# Patient Record
Sex: Female | Born: 1945 | Race: White | Hispanic: No | Smoking: Never smoker
Health system: Southern US, Community
[De-identification: ages and names within clinical notes are randomized; demographics above are authoritative.]

## PROBLEM LIST (undated history)

## (undated) DIAGNOSIS — K219 Gastro-esophageal reflux disease without esophagitis: Secondary | ICD-10-CM

## (undated) DIAGNOSIS — T7840XA Allergy, unspecified, initial encounter: Secondary | ICD-10-CM

## (undated) DIAGNOSIS — H269 Unspecified cataract: Secondary | ICD-10-CM

## (undated) DIAGNOSIS — Z860100 Personal history of colon polyps, unspecified: Secondary | ICD-10-CM

## (undated) DIAGNOSIS — M2550 Pain in unspecified joint: Secondary | ICD-10-CM

## (undated) DIAGNOSIS — I10 Essential (primary) hypertension: Secondary | ICD-10-CM

## (undated) DIAGNOSIS — E785 Hyperlipidemia, unspecified: Secondary | ICD-10-CM

## (undated) DIAGNOSIS — M1990 Unspecified osteoarthritis, unspecified site: Secondary | ICD-10-CM

## (undated) HISTORY — PX: JOINT REPLACEMENT: SHX530

## (undated) HISTORY — PX: COLONOSCOPY: SHX174

## (undated) HISTORY — DX: Allergy, unspecified, initial encounter: T78.40XA

## (undated) HISTORY — DX: Unspecified cataract: H26.9

## (undated) HISTORY — PX: TONSILLECTOMY: SUR1361

## (undated) HISTORY — PX: EYE SURGERY: SHX253

## (undated) HISTORY — PX: OTHER SURGICAL HISTORY: SHX169

---

## 1986-11-09 HISTORY — PX: WRIST SURGERY: SHX841

## 1992-11-09 HISTORY — PX: ABDOMINAL HYSTERECTOMY: SHX81

## 2015-11-10 HISTORY — PX: CARDIAC CATHETERIZATION: SHX172

## 2015-12-30 ENCOUNTER — Encounter (HOSPITAL_COMMUNITY)
Admission: RE | Disposition: A | Discharge status: Home / Self Care | Payer: Self-pay | Source: Ambulatory Visit | Attending: Ophthalmology

## 2015-12-30 ENCOUNTER — Ambulatory Visit (HOSPITAL_COMMUNITY)
Admission: RE | Admit: 2015-12-30 | Discharge: 2015-12-30 | Disposition: A | Discharge status: Home / Self Care | Payer: Medicare Other | Source: Ambulatory Visit | Attending: Ophthalmology | Admitting: Ophthalmology

## 2015-12-30 DIAGNOSIS — H2640 Unspecified secondary cataract: Secondary | ICD-10-CM | POA: Insufficient documentation

## 2015-12-30 DIAGNOSIS — H26492 Other secondary cataract, left eye: Secondary | ICD-10-CM | POA: Diagnosis not present

## 2015-12-30 HISTORY — PX: YAG LASER APPLICATION: SHX6189

## 2015-12-30 SURGERY — TREATMENT, USING YAG LASER
Anesthesia: LOCAL | Laterality: Left

## 2015-12-30 MED ORDER — TROPICAMIDE 1 % OP SOLN
OPHTHALMIC | Status: AC
  Filled 2015-12-30: qty 3

## 2015-12-30 MED ORDER — TROPICAMIDE 1 % OP SOLN
1.0000 [drp] | Freq: Once | OPHTHALMIC | Status: AC
  Administered 2015-12-30: 1 [drp] via OPHTHALMIC

## 2015-12-30 MED ORDER — TETRACAINE HCL 0.5 % OP SOLN
1.0000 [drp] | OPHTHALMIC | Status: AC | PRN
  Administered 2015-12-30: 1 [drp] via OPHTHALMIC

## 2015-12-30 MED ORDER — TETRACAINE HCL 0.5 % OP SOLN
OPHTHALMIC | Status: AC
  Filled 2015-12-30: qty 4

## 2015-12-30 NOTE — Brief Op Note (Signed)
Heather Cox 12/30/2015  Susa Simmonds, MD  Pre-op Diagnosis:  secondary cataract left eye  Post-op Diagnosis:   same  Yag laser self-test completed: Yes.    Indications:  See office scanned H&P  Procedure:   YAG poterior capsulotomy OS  Eye protection worn by staff:  Yes.   Laser In Use sign on door:  Yes.    Laser:  {LUMENIS YAG/SLT LASER  selecta duet  Power Setting:  1.7 mJ/burst Anatomical site treated:  Posterior capsule OS Number of applications:  42 Total energy delivered: 69.07 mJ Results:  Clear visualaxis  Patient was instructed to go to the office, as previously scheduled, for intraocular pressure:  No.  Patient verbalizes understanding of discharge instructions:  Yes.    Notes: pt tolerated procedure well, no complications

## 2015-12-30 NOTE — H&P (Signed)
I have reviewed the pre printed H&P, the patient was re-examined, and I have identified no significant interval changes in the patient's medical condition.  There is no change in the plan of care since the history and physical of record. 

## 2015-12-30 NOTE — Discharge Instructions (Signed)
[name]  12/30/2015     Instructions    Activity: No Restrictions.   Diet: Resume Diet you were on at home.   Pain Medication: Tylenol if Needed.   CONTACT YOUR DOCTOR IF YOU HAVE PAIN, REDNESS IN YOUR EYE, OR DECREASED VISION.   Follow-up:in 3 weeks with Susa Simmonds, MD.   Dr. Nile Riggs: [phone]  Dr. Lita Mains: [phone]  Dr. Alto Denver: [phone]   If you find that you cannot contact your physician, but feel that your signs and   Symptoms warrant a physician's attention, call the Emergency Room at   [phone].   Othern/a.  FOLLOW UP WITH DR. HAINES IN HIS OFFICE ON January 21, 2016 AT 10:45 AM

## 2015-12-31 ENCOUNTER — Encounter (HOSPITAL_COMMUNITY): Payer: Self-pay | Admitting: Ophthalmology

## 2016-02-03 DIAGNOSIS — H35372 Puckering of macula, left eye: Secondary | ICD-10-CM | POA: Diagnosis not present

## 2016-02-20 ENCOUNTER — Encounter (INDEPENDENT_AMBULATORY_CARE_PROVIDER_SITE_OTHER): Payer: Self-pay | Admitting: *Deleted

## 2016-02-20 ENCOUNTER — Encounter (INDEPENDENT_AMBULATORY_CARE_PROVIDER_SITE_OTHER): Payer: Self-pay

## 2016-03-04 DIAGNOSIS — M170 Bilateral primary osteoarthritis of knee: Secondary | ICD-10-CM | POA: Diagnosis not present

## 2016-03-04 DIAGNOSIS — M1711 Unilateral primary osteoarthritis, right knee: Secondary | ICD-10-CM | POA: Diagnosis not present

## 2016-03-04 DIAGNOSIS — M1712 Unilateral primary osteoarthritis, left knee: Secondary | ICD-10-CM | POA: Diagnosis not present

## 2016-03-10 DIAGNOSIS — M1711 Unilateral primary osteoarthritis, right knee: Secondary | ICD-10-CM | POA: Diagnosis not present

## 2016-03-10 DIAGNOSIS — M170 Bilateral primary osteoarthritis of knee: Secondary | ICD-10-CM | POA: Diagnosis not present

## 2016-03-10 DIAGNOSIS — M1712 Unilateral primary osteoarthritis, left knee: Secondary | ICD-10-CM | POA: Diagnosis not present

## 2016-03-17 DIAGNOSIS — R9439 Abnormal result of other cardiovascular function study: Secondary | ICD-10-CM | POA: Diagnosis not present

## 2016-03-17 DIAGNOSIS — R0602 Shortness of breath: Secondary | ICD-10-CM | POA: Diagnosis not present

## 2016-03-20 DIAGNOSIS — Z01818 Encounter for other preprocedural examination: Secondary | ICD-10-CM | POA: Diagnosis not present

## 2016-03-20 DIAGNOSIS — I10 Essential (primary) hypertension: Secondary | ICD-10-CM | POA: Diagnosis not present

## 2016-03-25 DIAGNOSIS — I10 Essential (primary) hypertension: Secondary | ICD-10-CM | POA: Diagnosis not present

## 2016-03-25 DIAGNOSIS — Z791 Long term (current) use of non-steroidal anti-inflammatories (NSAID): Secondary | ICD-10-CM | POA: Diagnosis not present

## 2016-03-25 DIAGNOSIS — E785 Hyperlipidemia, unspecified: Secondary | ICD-10-CM | POA: Diagnosis not present

## 2016-03-25 DIAGNOSIS — R9439 Abnormal result of other cardiovascular function study: Secondary | ICD-10-CM | POA: Diagnosis not present

## 2016-03-25 DIAGNOSIS — Z91013 Allergy to seafood: Secondary | ICD-10-CM | POA: Diagnosis not present

## 2016-03-25 DIAGNOSIS — Z79899 Other long term (current) drug therapy: Secondary | ICD-10-CM | POA: Diagnosis not present

## 2016-03-25 DIAGNOSIS — R0602 Shortness of breath: Secondary | ICD-10-CM | POA: Diagnosis not present

## 2016-04-10 ENCOUNTER — Ambulatory Visit: Payer: Self-pay | Admitting: Orthopedic Surgery

## 2016-04-28 DIAGNOSIS — M1711 Unilateral primary osteoarthritis, right knee: Secondary | ICD-10-CM | POA: Diagnosis not present

## 2016-05-06 DIAGNOSIS — H35372 Puckering of macula, left eye: Secondary | ICD-10-CM | POA: Diagnosis not present

## 2016-05-25 ENCOUNTER — Ambulatory Visit: Payer: Self-pay | Admitting: Orthopedic Surgery

## 2016-05-25 DIAGNOSIS — M25562 Pain in left knee: Secondary | ICD-10-CM | POA: Diagnosis not present

## 2016-05-25 DIAGNOSIS — M1712 Unilateral primary osteoarthritis, left knee: Secondary | ICD-10-CM | POA: Diagnosis not present

## 2016-05-25 NOTE — H&P (Signed)
TOTAL KNEE ADMISSION H&P  Patient is being admitted for right total knee arthroplasty.  Subjective:  Chief Complaint:right knee pain.  HPI: Heather Cox, 70 y.o. female, has a history of pain and functional disability in the right knee due to arthritis and has failed non-surgical conservative treatments for greater than 12 weeks to includeNSAID's and/or analgesics, corticosteriod injections, flexibility and strengthening excercises, use of assistive devices, weight reduction as appropriate and activity modification.  Onset of symptoms was gradual, starting >10 years ago with gradually worsening course since that time. The patient noted no past surgery on the right knee(s).  Patient currently rates pain in the right knee(s) at 10 out of 10 with activity. Patient has night pain, worsening of pain with activity and weight bearing, pain that interferes with activities of daily living, pain with passive range of motion and joint swelling.  Patient has evidence of subchondral cysts, subchondral sclerosis, periarticular osteophytes and joint space narrowing by imaging studies. There is no active infection.  There are no active problems to display for this patient.  No past medical history on file.  Past Surgical History  Procedure Laterality Date  . Yag laser application Left 12/30/2015    Procedure: YAG LASER APPLICATION;  Surgeon: Susa Simmonds, MD;  Location: [facility];  Service: Ophthalmology;  Laterality: Left;  rescheduled to 2/20      (Not in a hospital admission) No Known Allergies  Social History  Substance Use Topics  . Smoking status: Not on file  . Smokeless tobacco: Not on file  . Alcohol Use: Not on file    No family history on file.   Review of Systems  Constitutional: Negative.   HENT: Negative.   Eyes: Negative.   Respiratory: Negative.   Cardiovascular: Negative.   Gastrointestinal: Negative.   Genitourinary: Negative.   Musculoskeletal: Positive for joint pain.   Neurological: Negative.   Endo/Heme/Allergies: Negative.   Psychiatric/Behavioral: Negative.     Objective:  Physical Exam  Constitutional: She is oriented to person, place, and time. She appears well-developed and well-nourished.  HENT:  Head: Normocephalic and atraumatic.  Eyes: Conjunctivae and EOM are normal. Pupils are equal, round, and reactive to light.  Neck: Normal range of motion. Neck supple.  Cardiovascular: Normal rate and regular rhythm.   Respiratory: Effort normal and breath sounds normal. No respiratory distress.  GI: Soft. Bowel sounds are normal. She exhibits no distension.  Genitourinary:  deferred  Musculoskeletal:       Right knee: She exhibits decreased range of motion. Tenderness found. Medial joint line and lateral joint line tenderness noted.  ROM 8-115  Neurological: She is alert and oriented to person, place, and time. She has normal reflexes.  Skin: Skin is warm.  Psychiatric: She has a normal mood and affect. Her behavior is normal. Judgment and thought content normal.    Vital signs in last 24 hours: @  Labs:   There is no height or weight on file to calculate BMI.   Imaging Review Plain radiographs demonstrate severe degenerative joint disease of the bilaterally knee(s). The overall alignment issignificant varus. The bone quality appears to be adequate for age and reported activity level.  Assessment/Plan:  End stage arthritis, right knee   The patient history, physical examination, clinical judgment of the provider and imaging studies are consistent with end stage degenerative joint disease of the right knee(s) and total knee arthroplasty is deemed medically necessary. The treatment options including medical management, injection therapy arthroscopy and arthroplasty were discussed  at length. The risks and benefits of total knee arthroplasty were presented and reviewed. The risks due to aseptic loosening, infection, stiffness,  patella tracking problems, thromboembolic complications and other imponderables were discussed. The patient acknowledged the explanation, agreed to proceed with the plan and consent was signed. Patient is being admitted for inpatient treatment for surgery, pain control, PT, OT, prophylactic antibiotics, VTE prophylaxis, progressive ambulation and ADL's and discharge planning. The patient is planning to be discharged home with home health services

## 2016-05-28 ENCOUNTER — Encounter (HOSPITAL_COMMUNITY)
Admission: RE | Admit: 2016-05-28 | Discharge: 2016-05-28 | Disposition: A | Discharge status: Home / Self Care | Payer: Medicare Other | Source: Ambulatory Visit | Attending: Orthopedic Surgery | Admitting: Orthopedic Surgery

## 2016-05-28 ENCOUNTER — Encounter (HOSPITAL_COMMUNITY): Payer: Self-pay

## 2016-05-28 DIAGNOSIS — E785 Hyperlipidemia, unspecified: Secondary | ICD-10-CM | POA: Diagnosis not present

## 2016-05-28 DIAGNOSIS — K219 Gastro-esophageal reflux disease without esophagitis: Secondary | ICD-10-CM | POA: Insufficient documentation

## 2016-05-28 DIAGNOSIS — Z01812 Encounter for preprocedural laboratory examination: Secondary | ICD-10-CM | POA: Diagnosis not present

## 2016-05-28 DIAGNOSIS — Z01818 Encounter for other preprocedural examination: Secondary | ICD-10-CM | POA: Insufficient documentation

## 2016-05-28 DIAGNOSIS — M1711 Unilateral primary osteoarthritis, right knee: Secondary | ICD-10-CM | POA: Diagnosis not present

## 2016-05-28 DIAGNOSIS — Z79899 Other long term (current) drug therapy: Secondary | ICD-10-CM | POA: Diagnosis not present

## 2016-05-28 DIAGNOSIS — I10 Essential (primary) hypertension: Secondary | ICD-10-CM | POA: Diagnosis not present

## 2016-05-28 DIAGNOSIS — Z0183 Encounter for blood typing: Secondary | ICD-10-CM | POA: Insufficient documentation

## 2016-05-28 HISTORY — DX: Unspecified osteoarthritis, unspecified site: M19.90

## 2016-05-28 HISTORY — DX: Personal history of colonic polyps: Z86.010

## 2016-05-28 HISTORY — DX: Gastro-esophageal reflux disease without esophagitis: K21.9

## 2016-05-28 HISTORY — DX: Hyperlipidemia, unspecified: E78.5

## 2016-05-28 HISTORY — DX: Pain in unspecified joint: M25.50

## 2016-05-28 HISTORY — DX: Personal history of colon polyps, unspecified: Z86.0100

## 2016-05-28 HISTORY — DX: Essential (primary) hypertension: I10

## 2016-05-28 LAB — BASIC METABOLIC PANEL
ANION GAP: 6 (ref 5–15)
BUN: 23 mg/dL — AB (ref 6–20)
CO2: 25 mmol/L (ref 22–32)
Calcium: 9.3 mg/dL (ref 8.9–10.3)
Chloride: 108 mmol/L (ref 101–111)
Creatinine, Ser: 0.95 mg/dL (ref 0.44–1.00)
GFR calc Af Amer: >60 mL/min (ref >60)
GFR, EST NON AFRICAN AMERICAN: 58 mL/min — AB (ref >60)
GLUCOSE: 92 mg/dL (ref 65–99)
POTASSIUM: 4 mmol/L (ref 3.5–5.1)
Sodium: 139 mmol/L (ref 135–145)

## 2016-05-28 LAB — TYPE AND SCREEN
ABO/RH(D): O POS
Antibody Screen: NEGATIVE

## 2016-05-28 LAB — CBC
HEMATOCRIT: 43.8 % (ref 36.0–46.0)
HEMOGLOBIN: 13.8 g/dL (ref 12.0–15.0)
MCH: 28.8 pg (ref 26.0–34.0)
MCHC: 31.5 g/dL (ref 30.0–36.0)
MCV: 91.3 fL (ref 78.0–100.0)
Platelets: 239 10*3/uL (ref 150–400)
RBC: 4.8 MIL/uL (ref 3.87–5.11)
RDW: 13.6 % (ref 11.5–15.5)
WBC: 7.9 10*3/uL (ref 4.0–10.5)

## 2016-05-28 LAB — ABO/RH: ABO/RH(D): O POS

## 2016-05-28 LAB — SURGICAL PCR SCREEN
MRSA, PCR: NEGATIVE
Staphylococcus aureus: NEGATIVE

## 2016-05-28 MED ORDER — CHLORHEXIDINE GLUCONATE 4 % EX LIQD: 60.0000 mL | Freq: Once | CUTANEOUS | Status: DC

## 2016-05-28 NOTE — Progress Notes (Addendum)
Cardiologist is Dr.Clevenger in epic from 03-17-16 under care everywhere  Medical Md Dr.[place]  [facility] [place]  Stress test to be [facility]  Heart cath report under care everywhere  [facility]  [facility]

## 2016-05-28 NOTE — Pre-Procedure Instructions (Signed)
Heather Cox  05/28/2016      [facility] - [place], [place] - [address] [place] [address] Phone: [phone] Fax: [phone]  [facility] - [address], [place] - [address] [place] [address] Phone: [phone] Fax: [phone]    Your procedure is scheduled on Mon, July 31 @ 7:15 AM  [facility]:30 AM  Call this number if you have problems the morning of surgery:  [phone]   Remember:  Do not eat food or drink liquids after midnight.  Take these medicines the morning of surgery with A SIP OF WATER Atenolol(Tenormin)              Stop taking your Naproxen and Anacin. No Goody's,BC's,Advil,Motrin,Ibuprofen,Aspirin,Fish Oil,or any Herbal Medications.    Do not wear jewelry, make-up or nail polish.  Do not wear lotions, powders, or perfumes.    Do not shave 48 hours prior to surgery.    Do not bring valuables to the hospital.  [facility] is not responsible for any belongings or valuables.  Contacts, dentures or bridgework may not be worn into surgery.  Leave your suitcase in the car.  After surgery it may be brought to your room.  For patients admitted to the hospital, discharge time will be determined by your treatment team.  Patients discharged the day of surgery will not be allowed to drive home.    [facility]  Before surgery, you can play an important role.  Because skin is not sterile, your skin needs to be as free of germs as possible.  You can reduce the number of germs on you skin by washing with CHG (chlorahexidine gluconate) soap before surgery.  CHG is an antiseptic cleaner which kills germs and bonds with the skin to continue killing germs even after washing.  Please DO NOT use if you have an allergy to CHG or antibacterial soaps.  If your skin becomes reddened/irritated stop using  the CHG and inform your nurse when you arrive at Short Stay.  Do not shave (including legs and underarms) for at least 48 hours prior to the first CHG shower.  You may shave your face.  Please follow these instructions carefully:   1.  Shower with CHG Soap the night before surgery and the                                morning of Surgery.  2.  If you choose to wash your hair, wash your hair first as usual with your       normal shampoo.  3.  After you shampoo, rinse your hair and body thoroughly to remove the                      Shampoo.  4.  Use CHG as you would any other liquid soap.  You can apply chg directly       to the skin and wash gently with scrungie or a clean washcloth.  5.  Apply the CHG Soap to your body ONLY FROM THE NECK DOWN.        Do not use on open wounds or open sores.  Avoid contact with your eyes,       ears, mouth and genitals (private parts).  Wash  genitals (private parts)       with your normal soap.  6.  Wash thoroughly, paying special attention to the area where your surgery        will be performed.  7.  Thoroughly rinse your body with warm water from the neck down.  8.  DO NOT shower/wash with your normal soap after using and rinsing off       the CHG Soap.  9.  Pat yourself dry with a clean towel.            10.  Wear clean pajamas.            11.  Place clean sheets on your bed the night of your first shower and do not        sleep with pets.  Day of Surgery  Do not apply any lotions/deoderants the morning of surgery.  Please wear clean clothes to the hospital/surgery center.   Please read over the following fact sheets that you were given. Pain Booklet, Coughing and Deep Breathing, MRSA Information and Surgical Site Infection Prevention

## 2016-05-29 NOTE — Progress Notes (Signed)
Anesthesia Chart Review:  Pt is a 70 year old female scheduled for R total knee arthroplasty with computer navigation on 06/08/2016 with Samson FredericBrian Swinteck, MD.   PCP is Doreen Beamhruv Vyas, MD and cardiologist is Abelardo DieselJeffrey Clevenger, MD, both of whom have cleared pt for surgery.   PMH includes:  HTN, hyperlipidemia, GERD. Never smoker. BMI 37.5  Medications include: Atenolol, lipitor  Preoperative labs reviewed.    Chest x-ray 08/02/15 reviewed [facility] No active cardiopulmonary disease.    EKG 03/17/16: sinus rhythm. RSR (V1)  Cardiac cath 03/25/16 (Care everywhere): 1.Angiographically normal coronary arteries. 2. Right radial access.  Nuclear stress test 09/02/15: abnormal stress test, stress induced anteroapical defect  If no changes, I anticipate pt can proceed with surgery as scheduled.   Rica Mastngela Tariyah Pendry, FNP-BC [facility]Anesthesiology Phone: [phone] 05/29/2016 1:41 PM

## 2016-06-05 MED ORDER — TRANEXAMIC ACID 1000 MG/10ML IV SOLN
1000.0000 mg | INTRAVENOUS | Status: AC
  Administered 2016-06-08: 1000 mg via INTRAVENOUS
  Filled 2016-06-05: qty 10

## 2016-06-07 MED ORDER — CEFAZOLIN SODIUM-DEXTROSE 2-4 GM/100ML-% IV SOLN
2.0000 g | INTRAVENOUS | Status: AC
  Administered 2016-06-08: 2 g via INTRAVENOUS
  Filled 2016-06-07: qty 100

## 2016-06-08 ENCOUNTER — Encounter (HOSPITAL_COMMUNITY): Payer: Self-pay | Admitting: Anesthesiology

## 2016-06-08 ENCOUNTER — Inpatient Hospital Stay (HOSPITAL_COMMUNITY)
Admission: RE | Admit: 2016-06-08 | Discharge: 2016-06-09 | DRG: 470 | Disposition: A | Discharge status: Home / Self Care | Payer: Medicare Other | Source: Ambulatory Visit | Attending: Orthopedic Surgery | Admitting: Orthopedic Surgery

## 2016-06-08 ENCOUNTER — Inpatient Hospital Stay (HOSPITAL_COMMUNITY): Payer: Medicare Other | Admitting: Anesthesiology

## 2016-06-08 ENCOUNTER — Inpatient Hospital Stay (HOSPITAL_COMMUNITY): Payer: Medicare Other | Admitting: Emergency Medicine

## 2016-06-08 ENCOUNTER — Inpatient Hospital Stay (HOSPITAL_COMMUNITY): Payer: Medicare Other

## 2016-06-08 ENCOUNTER — Encounter (HOSPITAL_COMMUNITY)
Admission: RE | Disposition: A | Discharge status: Home / Self Care | Payer: Self-pay | Source: Ambulatory Visit | Attending: Orthopedic Surgery

## 2016-06-08 DIAGNOSIS — M179 Osteoarthritis of knee, unspecified: Secondary | ICD-10-CM | POA: Diagnosis not present

## 2016-06-08 DIAGNOSIS — K219 Gastro-esophageal reflux disease without esophagitis: Secondary | ICD-10-CM | POA: Diagnosis present

## 2016-06-08 DIAGNOSIS — M1711 Unilateral primary osteoarthritis, right knee: Secondary | ICD-10-CM | POA: Diagnosis present

## 2016-06-08 DIAGNOSIS — Z09 Encounter for follow-up examination after completed treatment for conditions other than malignant neoplasm: Secondary | ICD-10-CM

## 2016-06-08 DIAGNOSIS — Z96651 Presence of right artificial knee joint: Secondary | ICD-10-CM | POA: Diagnosis not present

## 2016-06-08 DIAGNOSIS — I10 Essential (primary) hypertension: Secondary | ICD-10-CM | POA: Diagnosis present

## 2016-06-08 DIAGNOSIS — M25561 Pain in right knee: Secondary | ICD-10-CM | POA: Diagnosis not present

## 2016-06-08 DIAGNOSIS — E785 Hyperlipidemia, unspecified: Secondary | ICD-10-CM | POA: Diagnosis not present

## 2016-06-08 DIAGNOSIS — M1990 Unspecified osteoarthritis, unspecified site: Secondary | ICD-10-CM | POA: Diagnosis not present

## 2016-06-08 DIAGNOSIS — Z471 Aftercare following joint replacement surgery: Secondary | ICD-10-CM | POA: Diagnosis not present

## 2016-06-08 HISTORY — PX: KNEE ARTHROPLASTY: SHX992

## 2016-06-08 SURGERY — ARTHROPLASTY, KNEE, TOTAL, USING IMAGELESS COMPUTER-ASSISTED NAVIGATION
Anesthesia: Spinal | Site: Knee | Laterality: Right

## 2016-06-08 MED ORDER — MIDAZOLAM HCL 5 MG/5ML IJ SOLN
INTRAMUSCULAR | Status: DC | PRN
Start: 2016-06-08 — End: 2016-06-08
  Administered 2016-06-08: 0.5 mg via INTRAVENOUS
  Administered 2016-06-08: 1 mg via INTRAVENOUS

## 2016-06-08 MED ORDER — DOCUSATE SODIUM 100 MG PO CAPS
100.0000 mg | ORAL_CAPSULE | Freq: Two times a day (BID) | ORAL | Status: DC
  Administered 2016-06-08 – 2016-06-09 (×2): 100 mg via ORAL
  Filled 2016-06-08 (×2): qty 1

## 2016-06-08 MED ORDER — ACETAMINOPHEN 10 MG/ML IV SOLN
INTRAVENOUS | Status: AC
  Filled 2016-06-08: qty 100

## 2016-06-08 MED ORDER — NEOSTIGMINE METHYLSULFATE 10 MG/10ML IV SOLN
INTRAVENOUS | Status: DC | PRN
  Administered 2016-06-08: 3 mg via INTRAVENOUS

## 2016-06-08 MED ORDER — SUCCINYLCHOLINE CHLORIDE 200 MG/10ML IV SOSY
PREFILLED_SYRINGE | INTRAVENOUS | Status: AC
  Filled 2016-06-08: qty 10

## 2016-06-08 MED ORDER — FENTANYL CITRATE (PF) 100 MCG/2ML IJ SOLN
INTRAMUSCULAR | Status: DC | PRN
  Administered 2016-06-08: 75 ug via INTRAVENOUS
  Administered 2016-06-08: 50 ug via INTRAVENOUS
  Administered 2016-06-08: 75 ug via INTRAVENOUS
  Administered 2016-06-08: 50 ug via INTRAVENOUS

## 2016-06-08 MED ORDER — METOCLOPRAMIDE HCL 5 MG PO TABS: 5.0000 mg | ORAL_TABLET | Freq: Three times a day (TID) | ORAL | Status: DC | PRN

## 2016-06-08 MED ORDER — LIDOCAINE HCL (CARDIAC) 20 MG/ML IV SOLN
INTRAVENOUS | Status: DC | PRN
  Administered 2016-06-08: 80 mg via INTRAVENOUS

## 2016-06-08 MED ORDER — HYDROMORPHONE HCL 1 MG/ML IJ SOLN
INTRAMUSCULAR | Status: AC
  Filled 2016-06-08: qty 1

## 2016-06-08 MED ORDER — ROCURONIUM BROMIDE 50 MG/5ML IV SOLN
INTRAVENOUS | Status: AC
  Filled 2016-06-08: qty 1

## 2016-06-08 MED ORDER — DIPHENHYDRAMINE HCL 12.5 MG/5ML PO ELIX: 12.5000 mg | ORAL_SOLUTION | ORAL | Status: DC | PRN

## 2016-06-08 MED ORDER — ATORVASTATIN CALCIUM 10 MG PO TABS
10.0000 mg | ORAL_TABLET | Freq: Every day | ORAL | Status: DC
  Administered 2016-06-08: 10 mg via ORAL
  Filled 2016-06-08: qty 1

## 2016-06-08 MED ORDER — GLYCOPYRROLATE 0.2 MG/ML IJ SOLN
INTRAMUSCULAR | Status: DC | PRN
  Administered 2016-06-08: 0.4 mg via INTRAVENOUS

## 2016-06-08 MED ORDER — SODIUM CHLORIDE 0.9 % IJ SOLN
INTRAMUSCULAR | Status: DC | PRN
  Administered 2016-06-08: 30 mL via INTRAVENOUS

## 2016-06-08 MED ORDER — NEOSTIGMINE METHYLSULFATE 5 MG/5ML IV SOSY
PREFILLED_SYRINGE | INTRAVENOUS | Status: AC
  Filled 2016-06-08: qty 5

## 2016-06-08 MED ORDER — EPHEDRINE SULFATE 50 MG/ML IJ SOLN
INTRAMUSCULAR | Status: DC | PRN
  Administered 2016-06-08 (×2): 10 mg via INTRAVENOUS
  Administered 2016-06-08: 5 mg via INTRAVENOUS

## 2016-06-08 MED ORDER — SENNA 8.6 MG PO TABS
2.0000 | ORAL_TABLET | Freq: Every day | ORAL | Status: DC
  Administered 2016-06-08: 17.2 mg via ORAL
  Filled 2016-06-08: qty 2

## 2016-06-08 MED ORDER — METHOCARBAMOL 1000 MG/10ML IJ SOLN
500.0000 mg | Freq: Four times a day (QID) | INTRAVENOUS | Status: DC | PRN
  Filled 2016-06-08: qty 5

## 2016-06-08 MED ORDER — ROCURONIUM BROMIDE 100 MG/10ML IV SOLN
INTRAVENOUS | Status: DC | PRN
  Administered 2016-06-08: 20 mg via INTRAVENOUS

## 2016-06-08 MED ORDER — DEXAMETHASONE SODIUM PHOSPHATE 10 MG/ML IJ SOLN
10.0000 mg | Freq: Once | INTRAMUSCULAR | Status: AC
  Administered 2016-06-09: 10 mg via INTRAVENOUS
  Filled 2016-06-08: qty 1

## 2016-06-08 MED ORDER — LIDOCAINE 2% (20 MG/ML) 5 ML SYRINGE
INTRAMUSCULAR | Status: AC
  Filled 2016-06-08: qty 5

## 2016-06-08 MED ORDER — PHENOL 1.4 % MT LIQD: 1.0000 | OROMUCOSAL | Status: DC | PRN

## 2016-06-08 MED ORDER — KETOROLAC TROMETHAMINE 30 MG/ML IJ SOLN
INTRAMUSCULAR | Status: DC | PRN
  Administered 2016-06-08: 30 mg via INTRAVENOUS

## 2016-06-08 MED ORDER — METOCLOPRAMIDE HCL 5 MG/ML IJ SOLN: 5.0000 mg | Freq: Three times a day (TID) | INTRAMUSCULAR | Status: DC | PRN

## 2016-06-08 MED ORDER — SODIUM CHLORIDE 0.9 % IV SOLN: INTRAVENOUS | Status: DC

## 2016-06-08 MED ORDER — ACETAMINOPHEN 325 MG PO TABS
650.0000 mg | ORAL_TABLET | Freq: Four times a day (QID) | ORAL | Status: DC | PRN
  Administered 2016-06-09: 650 mg via ORAL
  Filled 2016-06-08: qty 2

## 2016-06-08 MED ORDER — KETOROLAC TROMETHAMINE 15 MG/ML IJ SOLN
INTRAMUSCULAR | Status: AC
  Filled 2016-06-08: qty 1

## 2016-06-08 MED ORDER — BUPIVACAINE-EPINEPHRINE (PF) 0.5% -1:200000 IJ SOLN
INTRAMUSCULAR | Status: AC
  Filled 2016-06-08: qty 30

## 2016-06-08 MED ORDER — ONDANSETRON HCL 4 MG/2ML IJ SOLN: 4.0000 mg | Freq: Once | INTRAMUSCULAR | Status: DC | PRN

## 2016-06-08 MED ORDER — FENTANYL CITRATE (PF) 250 MCG/5ML IJ SOLN
INTRAMUSCULAR | Status: AC
  Filled 2016-06-08: qty 5

## 2016-06-08 MED ORDER — ATENOLOL 50 MG PO TABS
25.0000 mg | ORAL_TABLET | Freq: Every day | ORAL | Status: DC
  Administered 2016-06-09: 25 mg via ORAL
  Filled 2016-06-08: qty 1

## 2016-06-08 MED ORDER — CEFAZOLIN SODIUM-DEXTROSE 2-4 GM/100ML-% IV SOLN
2.0000 g | Freq: Four times a day (QID) | INTRAVENOUS | Status: AC
  Administered 2016-06-08 (×2): 2 g via INTRAVENOUS
  Filled 2016-06-08 (×2): qty 100

## 2016-06-08 MED ORDER — ALUM & MAG HYDROXIDE-SIMETH 200-200-20 MG/5ML PO SUSP: 30.0000 mL | ORAL | Status: DC | PRN

## 2016-06-08 MED ORDER — ONDANSETRON HCL 4 MG/2ML IJ SOLN
INTRAMUSCULAR | Status: AC
  Filled 2016-06-08: qty 2

## 2016-06-08 MED ORDER — PROPOFOL 10 MG/ML IV BOLUS
INTRAVENOUS | Status: AC
  Filled 2016-06-08: qty 40

## 2016-06-08 MED ORDER — POVIDONE-IODINE 10 % EX SWAB: 2.0000 "application " | Freq: Once | CUTANEOUS | Status: DC

## 2016-06-08 MED ORDER — PROPOFOL 10 MG/ML IV BOLUS
INTRAVENOUS | Status: DC | PRN
  Administered 2016-06-08: 200 mg via INTRAVENOUS

## 2016-06-08 MED ORDER — LACTATED RINGERS IV SOLN
INTRAVENOUS | Status: DC | PRN
  Administered 2016-06-08 (×3): via INTRAVENOUS

## 2016-06-08 MED ORDER — ALBUMIN HUMAN 5 % IV SOLN
INTRAVENOUS | Status: DC | PRN
  Administered 2016-06-08: 09:00:00 via INTRAVENOUS

## 2016-06-08 MED ORDER — ACETAMINOPHEN 650 MG RE SUPP: 650.0000 mg | Freq: Four times a day (QID) | RECTAL | Status: DC | PRN

## 2016-06-08 MED ORDER — BUPIVACAINE-EPINEPHRINE (PF) 0.5% -1:200000 IJ SOLN
INTRAMUSCULAR | Status: DC | PRN
  Administered 2016-06-08: 30 mL via PERINEURAL

## 2016-06-08 MED ORDER — EPHEDRINE 5 MG/ML INJ
INTRAVENOUS | Status: AC
  Filled 2016-06-08: qty 10

## 2016-06-08 MED ORDER — ONDANSETRON HCL 4 MG/2ML IJ SOLN: 4.0000 mg | Freq: Four times a day (QID) | INTRAMUSCULAR | Status: DC | PRN

## 2016-06-08 MED ORDER — MIDAZOLAM HCL 2 MG/2ML IJ SOLN
INTRAMUSCULAR | Status: AC
  Filled 2016-06-08: qty 2

## 2016-06-08 MED ORDER — HYDROCODONE-ACETAMINOPHEN 5-325 MG PO TABS
1.0000 | ORAL_TABLET | ORAL | Status: DC | PRN
  Administered 2016-06-08 – 2016-06-09 (×4): 2 via ORAL
  Filled 2016-06-08 (×4): qty 2

## 2016-06-08 MED ORDER — SUCCINYLCHOLINE CHLORIDE 20 MG/ML IJ SOLN
INTRAMUSCULAR | Status: DC | PRN
  Administered 2016-06-08: 80 mg via INTRAVENOUS

## 2016-06-08 MED ORDER — ONDANSETRON HCL 4 MG/2ML IJ SOLN
INTRAMUSCULAR | Status: DC | PRN
  Administered 2016-06-08: 4 mg via INTRAVENOUS

## 2016-06-08 MED ORDER — 0.9 % SODIUM CHLORIDE (POUR BTL) OPTIME
TOPICAL | Status: DC | PRN
  Administered 2016-06-08: 1000 mL

## 2016-06-08 MED ORDER — HYDROMORPHONE HCL 1 MG/ML IJ SOLN
0.5000 mg | INTRAMUSCULAR | Status: DC | PRN
  Administered 2016-06-08: 0.5 mg via INTRAVENOUS
  Administered 2016-06-08 (×2): 0.25 mg via INTRAVENOUS

## 2016-06-08 MED ORDER — GLYCOPYRROLATE 0.2 MG/ML IV SOSY
PREFILLED_SYRINGE | INTRAVENOUS | Status: AC
  Filled 2016-06-08: qty 3

## 2016-06-08 MED ORDER — ASPIRIN 81 MG PO CHEW
81.0000 mg | CHEWABLE_TABLET | Freq: Two times a day (BID) | ORAL | Status: DC
  Administered 2016-06-08 – 2016-06-09 (×2): 81 mg via ORAL
  Filled 2016-06-08 (×2): qty 1

## 2016-06-08 MED ORDER — ACETAMINOPHEN 10 MG/ML IV SOLN
1000.0000 mg | INTRAVENOUS | Status: AC
Start: 2016-06-08 — End: 2016-06-08
  Administered 2016-06-08: 1000 mg via INTRAVENOUS

## 2016-06-08 MED ORDER — HYDROMORPHONE HCL 1 MG/ML IJ SOLN: 0.5000 mg | INTRAMUSCULAR | Status: DC | PRN

## 2016-06-08 MED ORDER — METHOCARBAMOL 500 MG PO TABS: 500.0000 mg | ORAL_TABLET | Freq: Four times a day (QID) | ORAL | Status: DC | PRN

## 2016-06-08 MED ORDER — MENTHOL 3 MG MT LOZG: 1.0000 | LOZENGE | OROMUCOSAL | Status: DC | PRN

## 2016-06-08 MED ORDER — KETOROLAC TROMETHAMINE 15 MG/ML IJ SOLN
15.0000 mg | Freq: Four times a day (QID) | INTRAMUSCULAR | Status: AC
  Administered 2016-06-08 – 2016-06-09 (×4): 15 mg via INTRAVENOUS
  Filled 2016-06-08 (×3): qty 1

## 2016-06-08 MED ORDER — ONDANSETRON HCL 4 MG PO TABS
4.0000 mg | ORAL_TABLET | Freq: Four times a day (QID) | ORAL | Status: DC | PRN
Start: 2016-06-08 — End: 2016-06-09

## 2016-06-08 MED ORDER — TRANEXAMIC ACID 1000 MG/10ML IV SOLN
1000.0000 mg | Freq: Once | INTRAVENOUS | Status: AC
  Administered 2016-06-08: 1000 mg via INTRAVENOUS
  Filled 2016-06-08: qty 10

## 2016-06-08 SURGICAL SUPPLY — 47 items
ALCOHOL ISOPROPYL (RUBBING) (MISCELLANEOUS) ×3 IMPLANT
BANDAGE ELASTIC 6 VELCRO ST LF (GAUZE/BANDAGES/DRESSINGS) ×3 IMPLANT
BLADE SAW RECIP 87.9 MT (BLADE) ×3 IMPLANT
BONE CEMENT SIMPLEX TOBRAMYCIN (Cement) ×2 IMPLANT
CAPT KNEE TRIATH TK-4 ×3 IMPLANT
CEMENT BONE SIMPLEX TOBRAMYCIN (Cement) ×1 IMPLANT
CHLORAPREP W/TINT 26ML (MISCELLANEOUS) ×6 IMPLANT
CUFF TOURNIQUET SINGLE 34IN LL (TOURNIQUET CUFF) ×3 IMPLANT
DERMABOND ADVANCED (GAUZE/BANDAGES/DRESSINGS) ×2
DERMABOND ADVANCED .7 DNX12 (GAUZE/BANDAGES/DRESSINGS) ×1 IMPLANT
DRAIN HEMOVAC 7FR (DRAIN) ×3 IMPLANT
DRAPE SHEET LG 3/4 BI-LAMINATE (DRAPES) ×6 IMPLANT
DRAPE U-SHAPE 47X51 STRL (DRAPES) ×3 IMPLANT
DRAPE UNIVERSAL PACK (DRAPES) IMPLANT
DRSG AQUACEL AG ADV 3.5X14 (GAUZE/BANDAGES/DRESSINGS) ×3 IMPLANT
DRSG TEGADERM 2-3/8X2-3/4 SM (GAUZE/BANDAGES/DRESSINGS) ×3 IMPLANT
ELECT REM PT RETURN 9FT ADLT (ELECTROSURGICAL) ×3
ELECTRODE REM PT RTRN 9FT ADLT (ELECTROSURGICAL) ×1 IMPLANT
EVACUATOR 1/8 PVC DRAIN (DRAIN) IMPLANT
GLOVE BIO SURGEON STRL SZ8.5 (GLOVE) ×6 IMPLANT
GLOVE BIOGEL PI IND STRL 8.5 (GLOVE) ×1 IMPLANT
GLOVE BIOGEL PI INDICATOR 8.5 (GLOVE) ×2
GOWN STRL REUS W/TWL 2XL LVL3 (GOWN DISPOSABLE) ×3 IMPLANT
HANDPIECE INTERPULSE COAX TIP (DISPOSABLE) ×2
KIT BASIN OR (CUSTOM PROCEDURE TRAY) ×3 IMPLANT
KNEE PATELLA ASYMMETRIC 10X32 (Knees) ×3 IMPLANT
MANIFOLD NEPTUNE II (INSTRUMENTS) ×3 IMPLANT
NAVIGATION CASE RENTAL (PORTABLE EQUIPMENT SUPPLIES) ×3 IMPLANT
NEEDLE SPNL 18GX3.5 QUINCKE PK (NEEDLE) ×6 IMPLANT
PACK TOTAL JOINT (CUSTOM PROCEDURE TRAY) ×3 IMPLANT
PACK TOTAL KNEE CUSTOM (KITS) ×3 IMPLANT
PADDING CAST COTTON 6X4 STRL (CAST SUPPLIES) ×3 IMPLANT
PATELLA 32MMX10MM (Knees) ×3 IMPLANT
SAW OSC TIP CART 19.5X105X1.3 (SAW) ×3 IMPLANT
SEALER BIPOLAR AQUA 6.0 (INSTRUMENTS) ×3 IMPLANT
SET HNDPC FAN SPRY TIP SCT (DISPOSABLE) ×1 IMPLANT
SET PAD KNEE POSITIONER (MISCELLANEOUS) ×3 IMPLANT
SUT MNCRL AB 3-0 PS2 27 (SUTURE) ×3 IMPLANT
SUT MON AB 2-0 CT1 36 (SUTURE) ×9 IMPLANT
SUT MON AB 4-0 PC3 18 (SUTURE) ×3 IMPLANT
SUT VIC AB 1 CTX 27 (SUTURE) ×6 IMPLANT
SUT VIC AB 2-0 CT1 27 (SUTURE) ×2
SUT VIC AB 2-0 CT1 TAPERPNT 27 (SUTURE) ×1 IMPLANT
SUT VLOC 180 0 24IN GS25 (SUTURE) ×3 IMPLANT
SYR 50ML LL SCALE MARK (SYRINGE) ×6 IMPLANT
TOWER CARTRIDGE SMART MIX (DISPOSABLE) ×3 IMPLANT
WRAP KNEE MAXI GEL POST OP (GAUZE/BANDAGES/DRESSINGS) ×6 IMPLANT

## 2016-06-08 NOTE — Op Note (Signed)
OPERATIVE REPORT  SURGEON: Samson Frederic, MD   ASSISTANT: Hart Carwin, RNFA.  PREOPERATIVE DIAGNOSIS: Right knee arthritis.   POSTOPERATIVE DIAGNOSIS: Right knee arthritis.   PROCEDURE: Right total knee arthroplasty.   IMPLANTS: Stryker Triathlon CR femur, size 4. Stryker Tritanium tibia, size 4. X3 polyethelyene insert, size 13 mm, CS. 3 button asymmetric patella, size 32 mm. Simplex P cement.  ANESTHESIA:  General  TOURNIQUET TIME: Not utilized.   ESTIMATED BLOOD LOSS: 250 mL.  ANTIBIOTICS: 2 g Ancef.  DRAINS: None.  COMPLICATIONS: None   CONDITION: PACU - hemodynamically stable.   BRIEF CLINICAL NOTE: Heather Cox is a 70 y.o. female with a long-standing history of Right knee arthritis. After failing conservative management, the patient was indicated for total knee arthroplasty. The risks, benefits, and alternatives to the procedure were explained, and the patient elected to proceed.  PROCEDURE IN DETAIL: Spinal anesthesia was obtained in the pre-op holding area. Once inside the operative room, a foley catheter was inserted. The patient was then positioned, a nonsterile tourniquet was placed, and the lower extremity was prepped and draped in the normal sterile surgical fashion. A time-out was called verifying side and site of surgery. The patient received IV antibiotics within 60 minutes of beginning the procedure. The tourniquet was not utilized.  An anterior approach to the knee was performed utilizing a medial midvastus arthrotomy. A medial release was performed and the patellar fat pad was excised. Stryker navigation was used to cut the distal femur perpendicular to the mechanical axis. A freehand patellar resection was performed, and the patella was sized an prepared with 3 lug holes.  Nagivation was used to make a neutral proximal tibia resection, taking 8 mm of  bone from the less affected lateral side with 3 degrees of slope. The menisci were excised. A spacer block was placed, and the alignment and balance in extension were confirmed.   The distal femur was sized using the 3-degree external rotation guide referencing the posterior femoral cortex. The appropriate 4-in-1 cutting block was pinned into place. Rotation was checked using Whiteside's line, the epicondylar axis, and then confirmed with a spacer block in flexion. The remaining femoral cuts were performed, taking care to protect the MCL.  The tibia was sized and the trial tray was pinned into place. The remaining trail components were inserted. The knee was stable to varus and valgus stress through a full range of motion. The patella tracked centrally, and the PCL was well balanced. The trial components were removed, and the proximal tibial surface was prepared. Final components were impacted into place. On the back table, cement was mixed and the patellar component was cemented. Excess cement was cleared. Once the cement was hardened, the knee was tested for a final time and found to be well balanced.  The wound was copiously irrigated with a dilute betadine solution followed by normal saline with pulse lavage. Marcaine solution was injected into the periarticular soft tissue. The wound was closed in layers using #1 Vicryl and V-Loc for the fascia, 2-0 Vicryl for the subcutaneous fat, 2-0 Monocryl for the deep dermal layer, 3-0 running Monocryl subcuticular Stitch, and Dermabond for the skin. Once the glue was fully dried, an Aquacell Ag and compressive dressing were applied. Tthe patient was transported to the recovery room in stable condition. Sponge, needle, and instrument counts were correct at the end of the case x2. The patient tolerated the procedure well and there were no known complications.

## 2016-06-08 NOTE — Discharge Instructions (Signed)
° °Dr. Brenson Hartman °Total Joint Specialist °[facility] °[address] °[place], [place] [address] °[phone] ° °TOTAL KNEE REPLACEMENT POSTOPERATIVE DIRECTIONS ° ° ° °Knee Rehabilitation, Guidelines Following Surgery  °Results after knee surgery are often greatly improved when you follow the exercise, range of motion and muscle strengthening exercises prescribed by your doctor. Safety measures are also important to protect the knee from further injury. Any time any of these exercises cause you to have increased pain or swelling in your knee joint, decrease the amount until you are comfortable again and slowly increase them. If you have problems or questions, call your caregiver or physical therapist for advice.  ° °WEIGHT BEARING °Weight bearing as tolerated with assist device (walker, cane, etc) as directed, use it as long as suggested by your surgeon or therapist, typically at least 4-6 weeks. ° °HOME CARE INSTRUCTIONS  °Remove items at home which could result in a fall. This includes throw rugs or furniture in walking pathways.  °Continue medications as instructed at time of discharge. °You may have some home medications which will be placed on hold until you complete the course of blood thinner medication.  °You may start showering once you are discharged home but do not submerge the incision under water. Just pat the incision dry and apply a dry gauze dressing on daily. °Walk with walker as instructed.  °You may resume a sexual relationship in one month or when given the OK by your doctor.  °· Use walker as long as suggested by your caregivers. °· Avoid periods of inactivity such as sitting longer than an hour when not asleep. This helps prevent blood clots.  °You may put full weight on your legs and walk as much as is comfortable.  °You may return to work once you are cleared by your doctor.  °Do not drive a car for 6 weeks or until released by you surgeon.  °· Do not drive  while taking narcotics.  °Wear the elastic stockings for three weeks following surgery during the day but you may remove then at night. °Make sure you keep all of your appointments after your operation with all of your doctors and caregivers. You should call the office at the above phone number and make an appointment for approximately two weeks after the date of your surgery. °Do not remove your surgical dressing. The dressing is waterproof; you may take showers in 3 days, but do not take tub baths or submerge the dressing. °Please pick up a stool softener and laxative for home use as long as you are requiring pain medications. °· ICE to the affected knee every three hours for 30 minutes at a time and then as needed for pain and swelling.  Continue to use ice on the knee for pain and swelling from surgery. You may notice swelling that will progress down to the foot and ankle.  This is normal after surgery.  Elevate the leg when you are not up walking on it.   °It is important for you to complete the blood thinner medication as prescribed by your doctor. °· Continue to use the breathing machine which will help keep your temperature down.  It is common for your temperature to cycle up and down following surgery, especially at night when you are not up moving around and exerting yourself.  The breathing machine keeps your lungs expanded and your temperature down. ° °RANGE OF MOTION AND STRENGTHENING EXERCISES  °Rehabilitation of the knee is important following   a knee injury or an operation. After just a few days of immobilization, the muscles of the thigh which control the knee become weakened and shrink (atrophy). Knee exercises are designed to build up the tone and strength of the thigh muscles and to improve knee motion. Often times heat used for twenty to thirty minutes before working out will loosen up your tissues and help with improving the range of motion but do not use heat for the first two weeks following  surgery. These exercises can be done on a training (exercise) mat, on the floor, on a table or on a bed. Use what ever works the best and is most comfortable for you Knee exercises include:  °Leg Lifts - While your knee is still immobilized in a splint or cast, you can do straight leg raises. Lift the leg to 60 degrees, hold for 3 sec, and slowly lower the leg. Repeat 10-20 times 2-3 times daily. Perform this exercise against resistance later as your knee gets better.  °Quad and Hamstring Sets - Tighten up the muscle on the front of the thigh (Quad) and hold for 5-10 sec. Repeat this 10-20 times hourly. Hamstring sets are done by pushing the foot backward against an object and holding for 5-10 sec. Repeat as with quad sets.  °A rehabilitation program following serious knee injuries can speed recovery and prevent re-injury in the future due to weakened muscles. Contact your doctor or a physical therapist for more information on knee rehabilitation.  ° °SKILLED REHAB INSTRUCTIONS: °If the patient is transferred to a skilled rehab facility following release from the hospital, a list of the current medications will be sent to the facility for the patient to continue.  When discharged from the skilled rehab facility, please have the facility set up the patient's Home Health Physical Therapy prior to being released. Also, the skilled facility will be responsible for providing the patient with their medications at time of release from the facility to include their pain medication, the muscle relaxants, and their blood thinner medication. If the patient is still at the rehab facility at time of the two week follow up appointment, the skilled rehab facility will also need to assist the patient in arranging follow up appointment in our office and any transportation needs. ° °MAKE SURE YOU:  °Understand these instructions.  °Will watch your condition.  °Will get help right away if you are not doing well or get worse.   ° ° °Pick up stool softner and laxative for home use following surgery while on pain medications. °Do NOT remove your dressing. You may shower.  °Do not take tub baths or submerge incision under water. °May shower starting three days after surgery. °Please use a clean towel to pat the incision dry following showers. °Continue to use ice for pain and swelling after surgery. °Do not use any lotions or creams on the incision until instructed by your surgeon. ° °

## 2016-06-08 NOTE — H&P (View-Only) (Signed)
TOTAL KNEE ADMISSION H&P  Patient is being admitted for right total knee arthroplasty.  Subjective:  Chief Complaint:right knee pain.  HPI: Heather Cox, 70 y.o. female, has a history of pain and functional disability in the right knee due to arthritis and has failed non-surgical conservative treatments for greater than 12 weeks to includeNSAID's and/or analgesics, corticosteriod injections, flexibility and strengthening excercises, use of assistive devices, weight reduction as appropriate and activity modification.  Onset of symptoms was gradual, starting >10 years ago with gradually worsening course since that time. The patient noted no past surgery on the right knee(s).  Patient currently rates pain in the right knee(s) at 10 out of 10 with activity. Patient has night pain, worsening of pain with activity and weight bearing, pain that interferes with activities of daily living, pain with passive range of motion and joint swelling.  Patient has evidence of subchondral cysts, subchondral sclerosis, periarticular osteophytes and joint space narrowing by imaging studies. There is no active infection.  There are no active problems to display for this patient.  No past medical history on file.  Past Surgical History  Procedure Laterality Date  . Yag laser application Left 12/30/2015    Procedure: YAG LASER APPLICATION;  Surgeon: Susa Simmonds, MD;  Location: [facility];  Service: Ophthalmology;  Laterality: Left;  rescheduled to 2/20      (Not in a hospital admission) No Known Allergies  Social History  Substance Use Topics  . Smoking status: Not on file  . Smokeless tobacco: Not on file  . Alcohol Use: Not on file    No family history on file.   Review of Systems  Constitutional: Negative.   HENT: Negative.   Eyes: Negative.   Respiratory: Negative.   Cardiovascular: Negative.   Gastrointestinal: Negative.   Genitourinary: Negative.   Musculoskeletal: Positive for joint pain.   Neurological: Negative.   Endo/Heme/Allergies: Negative.   Psychiatric/Behavioral: Negative.     Objective:  Physical Exam  Constitutional: She is oriented to person, place, and time. She appears well-developed and well-nourished.  HENT:  Head: Normocephalic and atraumatic.  Eyes: Conjunctivae and EOM are normal. Pupils are equal, round, and reactive to light.  Neck: Normal range of motion. Neck supple.  Cardiovascular: Normal rate and regular rhythm.   Respiratory: Effort normal and breath sounds normal. No respiratory distress.  GI: Soft. Bowel sounds are normal. She exhibits no distension.  Genitourinary:  deferred  Musculoskeletal:       Right knee: She exhibits decreased range of motion. Tenderness found. Medial joint line and lateral joint line tenderness noted.  ROM 8-115  Neurological: She is alert and oriented to person, place, and time. She has normal reflexes.  Skin: Skin is warm.  Psychiatric: She has a normal mood and affect. Her behavior is normal. Judgment and thought content normal.    Vital signs in last 24 hours: @  Labs:   There is no height or weight on file to calculate BMI.   Imaging Review Plain radiographs demonstrate severe degenerative joint disease of the bilaterally knee(s). The overall alignment issignificant varus. The bone quality appears to be adequate for age and reported activity level.  Assessment/Plan:  End stage arthritis, right knee   The patient history, physical examination, clinical judgment of the provider and imaging studies are consistent with end stage degenerative joint disease of the right knee(s) and total knee arthroplasty is deemed medically necessary. The treatment options including medical management, injection therapy arthroscopy and arthroplasty were discussed  at length. The risks and benefits of total knee arthroplasty were presented and reviewed. The risks due to aseptic loosening, infection, stiffness,  patella tracking problems, thromboembolic complications and other imponderables were discussed. The patient acknowledged the explanation, agreed to proceed with the plan and consent was signed. Patient is being admitted for inpatient treatment for surgery, pain control, PT, OT, prophylactic antibiotics, VTE prophylaxis, progressive ambulation and ADL's and discharge planning. The patient is planning to be discharged home with home health services

## 2016-06-08 NOTE — Anesthesia Postprocedure Evaluation (Signed)
Anesthesia Post Note  Patient: ARISHA NOORDA  Procedure(s) Performed: Procedure(s) (LRB): RIGHT TOTAL KNEE ARTHROPLASTY WITH COMPUTER NAVIGATION (Right)  Patient location during evaluation: PACU Anesthesia Type: General Level of consciousness: awake and oriented Pain management: pain level controlled Vital Signs Assessment: post-procedure vital signs reviewed and stable Respiratory status: spontaneous breathing and respiratory function stable Cardiovascular status: stable and blood pressure returned to baseline Anesthetic complications: no    Last Vitals:  Vitals:   06/08/16 0616 06/08/16 1035  BP: (!) 144/76   Pulse: 66 70  Resp: 20 14  Temp: 36.8 C 36.5 C    Last Pain:  Vitals:   06/08/16 0616  TempSrc: Oral                 Rae Plotner EDWARD

## 2016-06-08 NOTE — Interval H&P Note (Signed)
History and Physical Interval Note:  06/08/2016 7:13 AM  Heather Cox  has presented today for surgery, with the diagnosis of DJD Right Knee  The various methods of treatment have been discussed with the patient and family. After consideration of risks, benefits and other options for treatment, the patient has consented to  Procedure(s) with comments: RIGHT TOTAL KNEE ARTHROPLASTY WITH COMPUTER NAVIGATION (Right) - Request RNFA  as a surgical intervention .  The patient's history has been reviewed, patient examined, no change in status, stable for surgery.  I have reviewed the patient's chart and labs.  Questions were answered to the patient's satisfaction.     [name], Cloyde Reams

## 2016-06-08 NOTE — Transfer of Care (Signed)
Immediate Anesthesia Transfer of Care Note  Patient: Heather Cox  Procedure(s) Performed: Procedure(s) with comments: RIGHT TOTAL KNEE ARTHROPLASTY WITH COMPUTER NAVIGATION (Right) - Request RNFA   Patient Location: PACU  Anesthesia Type:General  Level of Consciousness: awake, alert  and patient cooperative  Airway & Oxygen Therapy: Patient Spontanous Breathing and Patient connected to nasal cannula oxygen  Post-op Assessment: Report given to RN, Post -op Vital signs reviewed and stable and Patient moving all extremities  Post vital signs: Reviewed and stable  Last Vitals:  Vitals:   06/08/16 0616 06/08/16 1035  BP: (!) 144/76   Pulse: 66 70  Resp: 20 14  Temp: 36.8 C 36.5 C    Last Pain:  Vitals:   06/08/16 0616  TempSrc: Oral      Patients Stated Pain Goal: 1 (06/08/16 2094)  Complications: No apparent anesthesia complications

## 2016-06-08 NOTE — Evaluation (Signed)
Physical Therapy Evaluation Patient Details Name: Heather Cox MRN: [id number] DOB: 04-18-1946 Today's Date: 06/08/2016   History of Present Illness  Patient is a 70 y/o female with hx of HTN, HLD present s/p Rt TKA.  Clinical Impression  Patient presents with pain and post surgical deficits RLE s/p Rt TKA. Tolerated gait training with min A for balance/safety and Mod A to stand from surfaces. Education re: positioning, exercises, ice, mobility etc. Pt plans to discharge home with support of sister for 1 week. Will follow acutely to maximize independence and mobility prior to return home.    Follow Up Recommendations Home health PT;Supervision for mobility/OOB    Equipment Recommendations  None recommended by PT    Recommendations for Other Services OT consult     Precautions / Restrictions Precautions Precautions: Knee Precaution Booklet Issued: No Precaution Comments: Reviewed no pillow under knee and precautions Restrictions Weight Bearing Restrictions: Yes RLE Weight Bearing: Weight bearing as tolerated      Mobility  Bed Mobility Overal bed mobility: Needs Assistance Bed Mobility: Supine to Sit     Supine to sit: Min guard;HOB elevated     General bed mobility comments: No assist needed but increased time.   Transfers Overall transfer level: Needs assistance Equipment used: Rolling walker (2 wheeled) Transfers: Sit to/from Stand Sit to Stand: Mod assist         General transfer comment: Assist to boost from EOB with cues for hand placement. Stood from toilet x1.   Ambulation/Gait Ambulation/Gait assistance: Min assist Ambulation Distance (Feet): 14 Feet (+ 15') Assistive device: Rolling walker (2 wheeled) Gait Pattern/deviations: Decreased stance time - right;Decreased step length - left;Trunk flexed;Step-to pattern;Step-through pattern;Decreased stride length Gait velocity: decreased   General Gait Details: Increased WB through BUEs; cues to increase  left step length and to breathe as pt holding her breath at times.   Stairs            Wheelchair Mobility    Modified Rankin (Stroke Patients Only)       Balance Overall balance assessment: Needs assistance Sitting-balance support: Feet supported;No upper extremity supported Sitting balance-Leahy Scale: Good     Standing balance support: During functional activity Standing balance-Leahy Scale: Fair Standing balance comment: Able to stand and perform pericare with 1 UE support.                              Pertinent Vitals/Pain Pain Assessment: 0-10 Pain Score: 4  Pain Location: right knee with mobility Pain Descriptors / Indicators: Sore Pain Intervention(s): Monitored during session;Premedicated before session;Repositioned    Home Living Family/patient expects to be discharged to:: Private residence Living Arrangements: Alone Available Help at Discharge: Family;Available 24 hours/day (sister will be there for 1 week) Type of Home: House Home Access: Level entry     Home Layout: One level Home Equipment: Walker - 2 wheels      Prior Function Level of Independence: Independent               Hand Dominance        Extremity/Trunk Assessment   Upper Extremity Assessment: Defer to OT evaluation           Lower Extremity Assessment: RLE deficits/detail RLE Deficits / Details: Able to perform LAQ, and QS; but limited AROM/strength secondary to post op and pain       Communication   Communication: No difficulties  Cognition Arousal/Alertness: Awake/alert Behavior During  Therapy: WFL for tasks assessed/performed Overall Cognitive Status: Within Functional Limits for tasks assessed                      General Comments General comments (skin integrity, edema, etc.): Sister present during session.     Exercises Total Joint Exercises Ankle Circles/Pumps: Both;10 reps;Supine Quad Sets: Both;10 reps;Supine Gluteal Sets:  Both;10 reps;Supine      Assessment/Plan    PT Assessment Patient needs continued PT services  PT Diagnosis Acute pain;Difficulty walking   PT Problem List Decreased strength;Decreased mobility;Decreased range of motion;Decreased balance;Pain;Cardiopulmonary status limiting activity;Decreased activity tolerance  PT Treatment Interventions Therapeutic activities;Gait training;Functional mobility training;Balance training;Therapeutic exercise;Patient/family education;DME instruction   PT Goals (Current goals can be found in the Care Plan section) Acute Rehab PT Goals Patient Stated Goal: to be able to walk PT Goal Formulation: With patient Time For Goal Achievement: 06/15/16 Potential to Achieve Goals: Fair    Frequency 7X/week   Barriers to discharge        Co-evaluation               End of Session Equipment Utilized During Treatment: Gait belt Activity Tolerance: Patient tolerated treatment well Patient left: in chair;with call bell/phone within reach;with family/visitor present Nurse Communication: Mobility status         Time: 1352-1426 PT Time Calculation (min) (ACUTE ONLY): 34 min   Charges:   PT Evaluation $PT Eval Low Complexity: 1 Procedure PT Treatments $Gait Training: 8-22 mins   PT G Codes:        Kenni Newton A Layla Kesling 06/08/2016, 2:35 PM Mylo Red, PT, DPT [phone]

## 2016-06-08 NOTE — Addendum Note (Signed)
Addendum  created 06/08/16 1219 by Marni Griffon, CRNA   Sign clinical note

## 2016-06-08 NOTE — Anesthesia Preprocedure Evaluation (Addendum)
Anesthesia Evaluation  Patient identified by MRN, date of birth, ID band Patient awake    Reviewed: Allergy & Precautions, NPO status , Patient's Chart, lab work & pertinent test results  Airway Mallampati: I  TM Distance: >3 FB Neck ROM: Full    Dental  (+) Teeth Intact, Dental Advisory Given   Pulmonary     + decreased breath sounds      Cardiovascular hypertension, Pt. on home beta blockers and Pt. on medications Normal cardiovascular exam     Neuro/Psych    GI/Hepatic GERD  ,  Endo/Other    Renal/GU      Musculoskeletal  (+) Arthritis ,   Abdominal   Peds  Hematology   Anesthesia Other Findings   Reproductive/Obstetrics                            Anesthesia Physical Anesthesia Plan  ASA: II  Anesthesia Plan: General   Post-op Pain Management:    Induction: Intravenous  Airway Management Planned: Oral ETT  Additional Equipment:   Intra-op Plan:   Post-operative Plan: Extubation in OR  Informed Consent: I have reviewed the patients History and Physical, chart, labs and discussed the procedure including the risks, benefits and alternatives for the proposed anesthesia with the patient or authorized representative who has indicated his/her understanding and acceptance.     Plan Discussed with: CRNA, Surgeon and Anesthesiologist  Anesthesia Plan Comments:         Anesthesia Quick Evaluation

## 2016-06-08 NOTE — Anesthesia Procedure Notes (Signed)
Procedure Name: Intubation Date/Time: 06/08/2016 7:25 AM Performed by: Darcey Nora B Pre-anesthesia Checklist: Patient identified, Emergency Drugs available, Suction available and Patient being monitored Patient Re-evaluated:Patient Re-evaluated prior to inductionOxygen Delivery Method: Circle system utilized Preoxygenation: Pre-oxygenation with 100% oxygen Intubation Type: IV induction Ventilation: Mask ventilation without difficulty Laryngoscope Size: Mac and 3 Grade View: Grade III Tube size: 7.0 mm Number of attempts: 1 Airway Equipment and Method: Stylet Placement Confirmation: ETT inserted through vocal cords under direct vision,  positive ETCO2 and breath sounds checked- equal and bilateral Secured at: 21 (cm at teeth) cm Tube secured with: Tape Dental Injury: Teeth and Oropharynx as per pre-operative assessment

## 2016-06-08 NOTE — OR Nursing (Signed)
Pt placed on hold at 1200, waiting for xray. Pt otherwise ready for tx to 5n

## 2016-06-09 LAB — BASIC METABOLIC PANEL
Anion gap: 4 — ABNORMAL LOW (ref 5–15)
BUN: 14 mg/dL (ref 6–20)
CHLORIDE: 109 mmol/L (ref 101–111)
CO2: 27 mmol/L (ref 22–32)
CREATININE: 0.87 mg/dL (ref 0.44–1.00)
Calcium: 8.5 mg/dL — ABNORMAL LOW (ref 8.9–10.3)
GFR calc Af Amer: >60 mL/min (ref >60)
GFR calc non Af Amer: >60 mL/min (ref >60)
Glucose, Bld: 110 mg/dL — ABNORMAL HIGH (ref 65–99)
POTASSIUM: 4.3 mmol/L (ref 3.5–5.1)
SODIUM: 140 mmol/L (ref 135–145)

## 2016-06-09 LAB — CBC
HEMATOCRIT: 31.2 % — AB (ref 36.0–46.0)
HEMOGLOBIN: 9.6 g/dL — AB (ref 12.0–15.0)
MCH: 28.9 pg (ref 26.0–34.0)
MCHC: 30.8 g/dL (ref 30.0–36.0)
MCV: 94 fL (ref 78.0–100.0)
Platelets: 151 10*3/uL (ref 150–400)
RBC: 3.32 MIL/uL — AB (ref 3.87–5.11)
RDW: 13.9 % (ref 11.5–15.5)
WBC: 8.7 10*3/uL (ref 4.0–10.5)

## 2016-06-09 MED ORDER — METHOCARBAMOL 500 MG PO TABS: 500.0000 mg | ORAL_TABLET | Freq: Four times a day (QID) | ORAL | 0 refills | Status: DC | PRN

## 2016-06-09 MED ORDER — DOCUSATE SODIUM 100 MG PO CAPS: 100.0000 mg | ORAL_CAPSULE | Freq: Two times a day (BID) | ORAL | 3 refills | Status: DC

## 2016-06-09 MED ORDER — ASPIRIN 81 MG PO CHEW: 81.0000 mg | CHEWABLE_TABLET | Freq: Two times a day (BID) | ORAL | 1 refills | Status: DC

## 2016-06-09 MED ORDER — HYDROCODONE-ACETAMINOPHEN 7.5-325 MG PO TABS: 1.0000 | ORAL_TABLET | ORAL | 0 refills | Status: DC | PRN

## 2016-06-09 MED ORDER — SENNA 8.6 MG PO TABS: 2.0000 | ORAL_TABLET | Freq: Every day | ORAL | 3 refills | Status: DC

## 2016-06-09 MED ORDER — ONDANSETRON HCL 4 MG PO TABS: 4.0000 mg | ORAL_TABLET | Freq: Four times a day (QID) | ORAL | 0 refills | Status: DC | PRN

## 2016-06-09 NOTE — Plan of Care (Signed)
Problem: Safety: Goal: Ability to remain free from injury will improve Outcome: Progressing No safety issues noted  Problem: Pain Managment: Goal: General experience of comfort will improve Outcome: Progressing Pain well managed  Problem: Physical Regulation: Goal: Will remain free from infection Outcome: Progressing No S/S of infection noted, VS WNL  Problem: Activity: Goal: Risk for activity intolerance will decrease Outcome: Progressing Ambulating to BR with minimal assistance  Problem: Fluid Volume: Goal: Ability to maintain a balanced intake and output will improve Outcome: Progressing Taking PO fluid well, IVF at [facility]  Problem: Bowel/Gastric: Goal: Will not experience complications related to bowel motility Outcome: Progressing No bowel issues reported

## 2016-06-09 NOTE — Progress Notes (Signed)
   Subjective:  Patient reports pain as mild to moderate.  No c/o. Denies N/V/CP/SOB.  Objective:   VITALS:   Vitals:   06/08/16 1300 06/08/16 1312 06/08/16 2055 06/09/16 0352  BP:  112/63 114/64 (!) 113/50  Pulse: 68 67 66 81  Resp: 14 15 15 15   Temp:  98.1 F (36.7 C) 98.2 F (36.8 C) 97.9 F (36.6 C)  TempSrc:  Oral Oral Oral  SpO2: 100% 93% 96% 98%    ABD soft Sensation intact distally Intact pulses distally Dorsiflexion/Plantar flexion intact Incision: dressing C/D/I Compartment soft   Lab Results  Component Value Date   WBC 8.7 06/09/2016   HGB 9.6 (L) 06/09/2016   HCT 31.2 (L) 06/09/2016   MCV 94.0 06/09/2016   PLT 151 06/09/2016   BMET    Component Value Date/Time   NA 140 06/09/2016 0544   K 4.3 06/09/2016 0544   CL 109 06/09/2016 0544   CO2 27 06/09/2016 0544   GLUCOSE 110 (H) 06/09/2016 0544   BUN 14 06/09/2016 0544   CREATININE 0.87 06/09/2016 0544   CALCIUM 8.5 (L) 06/09/2016 0544   GFRNONAA >60 06/09/2016 0544   GFRAA >60 06/09/2016 0544     Assessment/Plan: 1 Day Post-Op   Principal Problem:   Primary osteoarthritis of right knee   WBAT with walker DVT ppx: ASA, SCDs, TEDs PO pain control PT/OT D/C home after clears therapy   Garnet Koyanagi 06/09/2016, 8:02 AM   Samson Frederic, MD Cell [phone]

## 2016-06-09 NOTE — Discharge Summary (Signed)
Physician Discharge Summary  Patient ID: Heather Cox MRN: [id number] DOB/AGE: [birth year] 70 y.o.  Admit date: 06/08/2016 Discharge date: 06/09/2016  Admission Diagnoses:  Primary osteoarthritis of right knee  Discharge Diagnoses:  Principal Problem:   Primary osteoarthritis of right knee   Past Medical History:  Diagnosis Date  . Arthritis   . GERD (gastroesophageal reflux disease)    occasionally and will take Tums if needed  . History of colon polyps    benign  . Hyperlipidemia    takes Atorvastatin daily  . Hypertension    takes Atenolol daily  . Joint pain     Surgeries: Procedure(s): RIGHT TOTAL KNEE ARTHROPLASTY WITH COMPUTER NAVIGATION on 06/08/2016   Consultants (if any):   Discharged Condition: Improved  Hospital Course: Heather Cox is an 70 y.o. female who was admitted 06/08/2016 with a diagnosis of Primary osteoarthritis of right knee and went to the operating room on 06/08/2016 and underwent the above named procedures.    She was given perioperative antibiotics:  Anti-infectives    Start     Dose/Rate Route Frequency Ordered Stop   06/08/16 1400  ceFAZolin (ANCEF) IVPB 2g/100 mL premix     2 g 200 mL/hr over 30 Minutes Intravenous Every 6 hours 06/08/16 1308 06/08/16 2115   06/08/16 0600  ceFAZolin (ANCEF) IVPB 2g/100 mL premix     2 g 200 mL/hr over 30 Minutes Intravenous On call to O.R. 06/07/16 1345 06/08/16 0738    .  She was given sequential compression devices, early ambulation, and ASA for DVT prophylaxis.  She benefited maximally from the hospital stay and there were no complications.    Recent vital signs:  Vitals:   06/08/16 2055 06/09/16 0352  BP: 114/64 (!) 113/50  Pulse: 66 81  Resp: 15 15  Temp: 98.2 F (36.8 C) 97.9 F (36.6 C)    Recent laboratory studies:  Lab Results  Component Value Date   HGB 9.6 (L) 06/09/2016   HGB 13.8 05/28/2016   Lab Results  Component Value Date   WBC 8.7 06/09/2016   PLT 151 06/09/2016    No results found for: INR Lab Results  Component Value Date   NA 140 06/09/2016   K 4.3 06/09/2016   CL 109 06/09/2016   CO2 27 06/09/2016   BUN 14 06/09/2016   CREATININE 0.87 06/09/2016   GLUCOSE 110 (H) 06/09/2016    Discharge Medications:     Medication List    TAKE these medications   ANACIN 400-32 MG Tabs Generic drug:  Aspirin-Caffeine Take 2 tablets by mouth 2 (two) times daily as needed (for pain or headache).   aspirin 81 MG chewable tablet Chew 1 tablet (81 mg total) by mouth 2 (two) times daily.   atenolol 25 MG tablet Commonly known as:  TENORMIN Take 25 mg by mouth daily.   atorvastatin 10 MG tablet Commonly known as:  LIPITOR Take 10 mg by mouth daily.   docusate sodium 100 MG capsule Commonly known as:  COLACE Take 1 capsule (100 mg total) by mouth 2 (two) times daily.   HYDROcodone-acetaminophen 7.5-325 MG tablet Commonly known as:  NORCO Take 1-2 tablets by mouth every 4 (four) hours as needed for moderate pain.   methocarbamol 500 MG tablet Commonly known as:  ROBAXIN Take 1 tablet (500 mg total) by mouth every 6 (six) hours as needed for muscle spasms.   naproxen sodium 220 MG tablet Commonly known as:  ANAPROX Take 220 mg by mouth  every 12 (twelve) hours as needed (for knee pain).   ondansetron 4 MG tablet Commonly known as:  ZOFRAN Take 1 tablet (4 mg total) by mouth every 6 (six) hours as needed for nausea.   senna 8.6 MG Tabs tablet Commonly known as:  SENOKOT Take 2 tablets (17.2 mg total) by mouth at bedtime.       Diagnostic Studies: Dg Knee Right Port  Result Date: 06/08/2016 CLINICAL DATA:  Right knee replacement EXAM: PORTABLE RIGHT KNEE - 1-2 VIEW COMPARISON:  None. FINDINGS: Changes of right knee replacement. Soft tissue and joint space gas. No hardware or bony complicating feature. IMPRESSION: Right knee replacement.  No complicating feature. Electronically Signed   By: Charlett Nose M.D.   On: 06/08/2016 13:05     Disposition: 01-Home or Self Care  Discharge Instructions    Call MD / Call 911    Complete by:  As directed   If you experience chest pain or shortness of breath, CALL 911 and be transported to the hospital emergency room.  If you develope a fever above 101 F, pus (white drainage) or increased drainage or redness at the wound, or calf pain, call your surgeon's office.   Constipation Prevention    Complete by:  As directed   Drink plenty of fluids.  Prune juice may be helpful.  You may use a stool softener, such as Colace (over the counter) 100 mg twice a day.  Use MiraLax (over the counter) for constipation as needed.   Diet - low sodium heart healthy    Complete by:  As directed   Do not put a pillow under the knee. Place it under the heel.    Complete by:  As directed   Driving restrictions    Complete by:  As directed   No driving for 6 weeks   Increase activity slowly as tolerated    Complete by:  As directed   Lifting restrictions    Complete by:  As directed   No lifting for 6 weeks   TED hose    Complete by:  As directed   Use stockings (TED hose) for 2 weeks on both leg(s).  You may remove them at night for sleeping.      Follow-up Information    Victor Granados, Cloyde Reams, MD. Schedule an appointment as soon as possible for a visit in 2 week(s).   Specialty:  Orthopedic Surgery Why:  For wound re-check Contact information: [address] [place] [address] [phone]            Signed: Garnet Koyanagi 06/09/2016, 7:18 PM

## 2016-06-09 NOTE — Progress Notes (Signed)
Reviewed discharge papers and medications with full understanding  Heather Cox

## 2016-06-09 NOTE — Care Management Note (Signed)
Case Management Note  Patient Details  Name: Heather Cox MRN: [id number] Date of Birth: [birth year]  Subjective/Objective:  70 yr old female s/p right total knee arthroplasty.                  Action/Plan: Case manager spoke with patient concerning home health and DME needs. Patient states she will not have home health, she will start outpatient therapy at [facility] on Thursday, 06/11/16. Patient's sister is here from out of town to assist her at discharge. Patient has rolling walker and 3in1.   Expected Discharge Date:    06/09/16              Expected Discharge Plan:   Home/self care  In-House Referral:  NA  Discharge planning Services  CM Consult  Post Acute Care Choice:  NA Choice offered to:  Patient  DME Arranged:  N/A DME Agency:  NA  HH Arranged:  NA HH Agency:  NA  Status of Service:  Completed, signed off  If discussed at Long Length of Stay Meetings, dates discussed:    Additional Comments:  [name], RN 06/09/2016, 2:13 PM

## 2016-06-09 NOTE — Care Management Important Message (Signed)
Important Message  Patient Details  Name: Heather Cox MRN: [id number] Date of Birth: [birth year]   Medicare Important Message Given:  Yes    Bernadette Hoit 06/09/2016, 8:14 AM

## 2016-06-09 NOTE — Progress Notes (Signed)
Physical Therapy Treatment Patient Details Name: Heather Cox MRN: [id number] DOB: [birth year] Today's Date: 06/09/2016    History of Present Illness Patient is a 70 y/o female with hx of HTN, HLD present s/p Rt TKA.    PT Comments    Pt performed increased gait and reviewed HEP for d/c home. PTA issued HEP and will follow up for pm treatment before d/c today.   Follow Up Recommendations  Home health PT;Supervision for mobility/OOB     Equipment Recommendations  None recommended by PT    Recommendations for Other Services OT consult     Precautions / Restrictions Precautions Precautions: Knee Precaution Booklet Issued: No Precaution Comments: Reviewed no pillow under knee and precautions Restrictions Weight Bearing Restrictions: Yes RLE Weight Bearing: Weight bearing as tolerated    Mobility  Bed Mobility               General bed mobility comments: Pt received in recliner on arrival.    Transfers Overall transfer level: Needs assistance Equipment used: Rolling walker (2 wheeled) Transfers: Sit to/from Stand Sit to Stand: Supervision         General transfer comment: Good technique.  Supervision for safety.    Ambulation/Gait Ambulation/Gait assistance: Supervision Ambulation Distance (Feet): 110 Feet Assistive device: Rolling walker (2 wheeled) Gait Pattern/deviations: Step-through pattern;Trunk flexed;Antalgic Gait velocity: decreased   General Gait Details: Increased WB through BUEs; cues to increase left step length and to breathe as pt holding her breath at times. Cues for upright posture.     Stairs            Wheelchair Mobility    Modified Rankin (Stroke Patients Only)       Balance Overall balance assessment: No apparent balance deficits (not formally assessed)                                  Cognition Arousal/Alertness: Awake/alert Behavior During Therapy: WFL for tasks assessed/performed Overall Cognitive  Status: Within Functional Limits for tasks assessed                      Exercises Total Joint Exercises Ankle Circles/Pumps: AROM;Both;10 reps;Supine Quad Sets: AROM;Right;10 reps;Supine Towel Squeeze: AROM;Both;10 reps;Supine Short Arc Quad: AROM;Right;10 reps;Supine Heel Slides: AAROM;Right;10 reps;Supine Hip ABduction/ADduction: AAROM;Right;10 reps;Supine Straight Leg Raises: Right;10 reps;Supine;AAROM Long Arc Quad: AROM;Right;10 reps;Seated Knee Flexion: AROM;Right;5 reps;Seated Goniometric ROM: 0-80 degrees R knee    General Comments        Pertinent Vitals/Pain Pain Assessment: 0-10 Pain Score: 3  Pain Location: R knee Pain Descriptors / Indicators: Discomfort Pain Intervention(s): Repositioned;Limited activity within patient's tolerance;Ice applied    Home Living                      Prior Function            PT Goals (current goals can now be found in the care plan section) Acute Rehab PT Goals Patient Stated Goal: to be able to walk Potential to Achieve Goals: Fair Progress towards PT goals: Progressing toward goals    Frequency  7X/week    PT Plan Current plan remains appropriate    Co-evaluation             End of Session Equipment Utilized During Treatment: Gait belt Activity Tolerance: Patient tolerated treatment well Patient left: in chair;with call bell/phone within reach;with family/visitor present  Time: 4656-8127 PT Time Calculation (min) (ACUTE ONLY): 26 min  Charges:  $Gait Training: 8-22 mins $Therapeutic Exercise: 8-22 mins                    G Codes:      Heather Cox 07-06-2016, 12:29 PM  Heather Cox, PTA pager [phone]

## 2016-06-09 NOTE — Progress Notes (Signed)
Physical Therapy Treatment Patient Details Name: Heather Cox MRN: [id number] DOB: [birth year] Today's Date: 06/09/2016    History of Present Illness Patient is a 70 y/o female with hx of HTN, HLD present s/p Rt TKA.    PT Comments    Pt performed increased gait and reviewed HEP.  Pt is ready for d/c home at this time.  RN informed.    Follow Up Recommendations  Home health PT;Supervision for mobility/OOB     Equipment Recommendations  None recommended by PT    Recommendations for Other Services OT consult     Precautions / Restrictions Precautions Precautions: Knee Precaution Booklet Issued: No Precaution Comments: Reviewed no pillow under knee and precautions Restrictions Weight Bearing Restrictions: Yes RLE Weight Bearing: Weight bearing as tolerated    Mobility  Bed Mobility Overal bed mobility: Needs Assistance Bed Mobility: Supine to Sit;Sit to Supine     Supine to sit: Supervision Sit to supine: Supervision   General bed mobility comments: Cues for sequencing and hand placement.  Pt able to boost in bed in supine unassisted.    Transfers Overall transfer level: Needs assistance Equipment used: Rolling walker (2 wheeled) Transfers: Sit to/from Stand Sit to Stand: Supervision         General transfer comment: Good technique.  Supervision for safety.    Ambulation/Gait Ambulation/Gait assistance: Supervision Ambulation Distance (Feet): 140 Feet Assistive device: Rolling walker (2 wheeled) Gait Pattern/deviations: Step-through pattern;Trunk flexed;Antalgic Gait velocity: decreased   General Gait Details: Pt required cues for sequencing and forward gaze.  Pt improved gait speed and endurance during pm tx.    Stairs Stairs:  (no stairs in home or outside of home.  )          Wheelchair Mobility    Modified Rankin (Stroke Patients Only)       Balance Overall balance assessment: No apparent balance deficits (not formally assessed)                                   Cognition Arousal/Alertness: Awake/alert Behavior During Therapy: WFL for tasks assessed/performed Overall Cognitive Status: Within Functional Limits for tasks assessed                      Exercises Total Joint Exercises Ankle Circles/Pumps: AROM;Both;10 reps;Supine Quad Sets: AROM;Right;10 reps;Supine Towel Squeeze: AROM;Both;10 reps;Supine Short Arc Quad: AROM;Right;10 reps;Supine Heel Slides: AAROM;Right;10 reps;Supine Hip ABduction/ADduction: AAROM;Right;10 reps;Supine Straight Leg Raises: Right;10 reps;Supine;AAROM Long Arc Quad: AROM;Right;10 reps;Seated Knee Flexion: AROM;Right;5 reps;Seated Goniometric ROM: 0-80 degrees R knee    General Comments        Pertinent Vitals/Pain Pain Assessment: 0-10 Pain Score: 3  Pain Location: R knee Pain Descriptors / Indicators: Discomfort Pain Intervention(s): Monitored during session;Repositioned    Home Living                      Prior Function            PT Goals (current goals can now be found in the care plan section) Acute Rehab PT Goals Patient Stated Goal: to be able to walk Potential to Achieve Goals: Good Progress towards PT goals: Progressing toward goals    Frequency  7X/week    PT Plan Current plan remains appropriate    Co-evaluation             End of Session Equipment Utilized During Treatment: Gait  belt Activity Tolerance: Patient tolerated treatment well Patient left: in chair;with call bell/phone within reach;with family/visitor present    . Time: 1330-1405 PT Time Calculation (min) (ACUTE ONLY): 35 min  Charges:  $Gait Training: 8-22 mins $Therapeutic Exercise: 8-22 mins                    G Codes:      Florestine Avers Jun 21, 2016, 2:16 PM Joycelyn Rua, PTA pager [phone]

## 2016-06-09 NOTE — Evaluation (Signed)
Occupational Therapy Evaluation Patient Details Name: Heather Cox MRN: [id number] DOB: 04-30-1946 Today's Date: 06/09/2016    History of Present Illness Patient is a 70 y/o female with hx of HTN, HLD present s/p Rt TKA.   Clinical Impression   Pt is a pleasant woman who PTA was independent in ADL and IADL. Pt able to perform sink level ADL, clothing manipulation and peri care for toileting at supervision level. Pt provided with education around LB dressing and bathing. Pt will have sister at home with her for week providing 24 hour care. Pt has good strategies to compensate for decreased independence in ADL, and safety concerns. No further OT needs at this time.      Follow Up Recommendations  No OT follow up;Supervision/Assistance - 24 hour    Equipment Recommendations  None recommended by OT    Recommendations for Other Services       Precautions / Restrictions Precautions Precautions: Knee Restrictions Weight Bearing Restrictions: Yes RLE Weight Bearing: Weight bearing as tolerated      Mobility Bed Mobility Overal bed mobility: Needs Assistance Bed Mobility: Supine to Sit     Supine to sit: Min guard     General bed mobility comments: No assist needed but increased time.   Transfers Overall transfer level: Needs assistance Equipment used: Rolling walker (2 wheeled) Transfers: Sit to/from Stand Sit to Stand: Min guard         General transfer comment: Pt required extended time from sit to stand. Verbal cues for hand placement    Balance Overall balance assessment: No apparent balance deficits (not formally assessed)                                          ADL Overall ADL's : Needs assistance/impaired Eating/Feeding: Set up;Sitting   Grooming: Wash/dry hands;Wash/dry face;Oral care;Supervision/safety;Standing Grooming Details (indicate cue type and reason): Pt able to stand at sink with RW and perform ADL Upper Body Bathing: Set  up;Sitting   Lower Body Bathing: Moderate assistance;Sit to/from stand Lower Body Bathing Details (indicate cue type and reason): Educated Pt on extended sponge for lower body bathing from shower seat Upper Body Dressing : Set up;Sitting   Lower Body Dressing: Maximal assistance;Sit to/from stand Lower Body Dressing Details (indicate cue type and reason): educated Pt on AE kit to assist. Pt says that she does not wear socks, and prefers to go barefoot year round. Pt says that she bought night dresses in anticipation of recovery period. Sister will assist with LB dressing in other areas. Education still provided (dress operative leg first) Toilet Transfer: Min guard;BSC;RW   Toileting- Water quality scientist and Hygiene: Supervision/safety;Sit to/from stand   Tub/ Shower Transfer: Walk-in shower;Cueing for safety;Ambulation;3 in 1;Rolling walker (Education provided for shower entry with RW and shower seat)   Functional mobility during ADLs: Min guard;Rolling walker General ADL Comments: Pt states that her sister will be providing care for a week. Sister is a retired Marine scientist.     Vision     Perception     Praxis      Pertinent Vitals/Pain Pain Assessment: 0-10 Pain Score: 6  Pain Descriptors / Indicators: Discomfort;Grimacing;Guarding;Sharp;Shooting;Throbbing Pain Intervention(s): Monitored during session;Repositioned;RN gave pain meds during session;Ice applied     Hand Dominance Right   Extremity/Trunk Assessment Upper Extremity Assessment Upper Extremity Assessment: Overall WFL for tasks assessed   Lower Extremity Assessment Lower  Extremity Assessment: RLE deficits/detail RLE Deficits / Details: limited ROM and decreased strength post surgery       Communication Communication Communication: No difficulties   Cognition Arousal/Alertness: Awake/alert Behavior During Therapy: WFL for tasks assessed/performed Overall Cognitive Status: Within Functional Limits for tasks  assessed                     General Comments       Exercises       Shoulder Instructions      Home Living Family/patient expects to be discharged to:: Private residence Living Arrangements: Other (Comment);Alone (4 cats) Available Help at Discharge: Family;Available 24 hours/day Type of Home: House Home Access: Level entry     Home Layout: One level     Bathroom Shower/Tub: Occupational psychologist: Standard Bathroom Accessibility: Yes How Accessible: Accessible via walker Home Equipment: [place] - 2 wheels;Bedside commode;Shower seat   Additional Comments: sister  that is staying with her is a former Marine scientist      Prior Functioning/Environment Level of Independence: Independent             OT Diagnosis: Generalized weakness;Acute pain   OT Problem List: Decreased strength;Decreased range of motion;Decreased activity tolerance;Pain;Increased edema   OT Treatment/Interventions:      OT Goals(Current goals can be found in the care plan section) Acute Rehab OT Goals Patient Stated Goal: to be able to walk OT Goal Formulation: With patient Time For Goal Achievement: 06/16/16 Potential to Achieve Goals: Good  OT Frequency:     Barriers to D/C:            Co-evaluation              End of Session Equipment Utilized During Treatment: Gait belt;Rolling walker;Other (comment) (3 in 1) Nurse Communication: Mobility status  Activity Tolerance: Patient tolerated treatment well;Patient limited by fatigue;Other (comment) (fatigue post session. walk to bathroom and standing ADL) Patient left: in chair;with call bell/phone within reach   Time: 9012-2241 OT Time Calculation (min): 48 min Charges:  OT General Charges $OT Visit: 1 Procedure OT Evaluation $OT Eval Low Complexity: 1 Procedure OT Treatments $Self Care/Home Management : 23-37 mins G-Codes:    Merri Ray Mayara Paulson OTR/L 06/09/2016, 10:09 AM [phone]

## 2016-06-10 ENCOUNTER — Encounter (HOSPITAL_COMMUNITY): Payer: Self-pay | Admitting: Orthopedic Surgery

## 2016-06-11 DIAGNOSIS — M1711 Unilateral primary osteoarthritis, right knee: Secondary | ICD-10-CM | POA: Diagnosis not present

## 2016-06-15 DIAGNOSIS — M1711 Unilateral primary osteoarthritis, right knee: Secondary | ICD-10-CM | POA: Diagnosis not present

## 2016-06-17 DIAGNOSIS — M1711 Unilateral primary osteoarthritis, right knee: Secondary | ICD-10-CM | POA: Diagnosis not present

## 2016-06-19 DIAGNOSIS — M1711 Unilateral primary osteoarthritis, right knee: Secondary | ICD-10-CM | POA: Diagnosis not present

## 2016-06-23 DIAGNOSIS — Z96651 Presence of right artificial knee joint: Secondary | ICD-10-CM | POA: Diagnosis not present

## 2016-06-23 DIAGNOSIS — Z471 Aftercare following joint replacement surgery: Secondary | ICD-10-CM | POA: Diagnosis not present

## 2016-06-23 DIAGNOSIS — M1711 Unilateral primary osteoarthritis, right knee: Secondary | ICD-10-CM | POA: Diagnosis not present

## 2016-06-25 DIAGNOSIS — M1711 Unilateral primary osteoarthritis, right knee: Secondary | ICD-10-CM | POA: Diagnosis not present

## 2016-06-30 DIAGNOSIS — M1711 Unilateral primary osteoarthritis, right knee: Secondary | ICD-10-CM | POA: Diagnosis not present

## 2016-07-02 DIAGNOSIS — M1711 Unilateral primary osteoarthritis, right knee: Secondary | ICD-10-CM | POA: Diagnosis not present

## 2016-07-07 DIAGNOSIS — M1711 Unilateral primary osteoarthritis, right knee: Secondary | ICD-10-CM | POA: Diagnosis not present

## 2016-07-09 DIAGNOSIS — M1711 Unilateral primary osteoarthritis, right knee: Secondary | ICD-10-CM | POA: Diagnosis not present

## 2016-07-14 DIAGNOSIS — M1711 Unilateral primary osteoarthritis, right knee: Secondary | ICD-10-CM | POA: Diagnosis not present

## 2016-07-17 DIAGNOSIS — M1711 Unilateral primary osteoarthritis, right knee: Secondary | ICD-10-CM | POA: Diagnosis not present

## 2016-07-21 DIAGNOSIS — M1711 Unilateral primary osteoarthritis, right knee: Secondary | ICD-10-CM | POA: Diagnosis not present

## 2016-07-22 DIAGNOSIS — Z96651 Presence of right artificial knee joint: Secondary | ICD-10-CM | POA: Diagnosis not present

## 2016-07-22 DIAGNOSIS — Z471 Aftercare following joint replacement surgery: Secondary | ICD-10-CM | POA: Diagnosis not present

## 2016-07-22 DIAGNOSIS — M1711 Unilateral primary osteoarthritis, right knee: Secondary | ICD-10-CM | POA: Diagnosis not present

## 2016-07-23 DIAGNOSIS — M1711 Unilateral primary osteoarthritis, right knee: Secondary | ICD-10-CM | POA: Diagnosis not present

## 2016-07-28 DIAGNOSIS — M1711 Unilateral primary osteoarthritis, right knee: Secondary | ICD-10-CM | POA: Diagnosis not present

## 2016-07-30 DIAGNOSIS — M1711 Unilateral primary osteoarthritis, right knee: Secondary | ICD-10-CM | POA: Diagnosis not present

## 2016-08-04 DIAGNOSIS — M1711 Unilateral primary osteoarthritis, right knee: Secondary | ICD-10-CM | POA: Diagnosis not present

## 2016-08-06 DIAGNOSIS — M1711 Unilateral primary osteoarthritis, right knee: Secondary | ICD-10-CM | POA: Diagnosis not present

## 2016-08-07 DIAGNOSIS — Z7189 Other specified counseling: Secondary | ICD-10-CM | POA: Diagnosis not present

## 2016-08-07 DIAGNOSIS — Z299 Encounter for prophylactic measures, unspecified: Secondary | ICD-10-CM | POA: Diagnosis not present

## 2016-08-07 DIAGNOSIS — E7800 Pure hypercholesterolemia, unspecified: Secondary | ICD-10-CM | POA: Diagnosis not present

## 2016-08-07 DIAGNOSIS — Z1389 Encounter for screening for other disorder: Secondary | ICD-10-CM | POA: Diagnosis not present

## 2016-08-07 DIAGNOSIS — Z6839 Body mass index (BMI) 39.0-39.9, adult: Secondary | ICD-10-CM | POA: Diagnosis not present

## 2016-08-07 DIAGNOSIS — Z0000 Encounter for general adult medical examination without abnormal findings: Secondary | ICD-10-CM | POA: Diagnosis not present

## 2016-08-10 DIAGNOSIS — Z79899 Other long term (current) drug therapy: Secondary | ICD-10-CM | POA: Diagnosis not present

## 2016-08-10 DIAGNOSIS — R5383 Other fatigue: Secondary | ICD-10-CM | POA: Diagnosis not present

## 2016-08-10 DIAGNOSIS — E7800 Pure hypercholesterolemia, unspecified: Secondary | ICD-10-CM | POA: Diagnosis not present

## 2016-08-20 DIAGNOSIS — Z1231 Encounter for screening mammogram for malignant neoplasm of breast: Secondary | ICD-10-CM | POA: Diagnosis not present

## 2016-08-25 DIAGNOSIS — H43392 Other vitreous opacities, left eye: Secondary | ICD-10-CM | POA: Diagnosis not present

## 2016-08-25 DIAGNOSIS — H35372 Puckering of macula, left eye: Secondary | ICD-10-CM | POA: Diagnosis not present

## 2016-08-25 DIAGNOSIS — H43813 Vitreous degeneration, bilateral: Secondary | ICD-10-CM | POA: Diagnosis not present

## 2016-08-31 DIAGNOSIS — H35372 Puckering of macula, left eye: Secondary | ICD-10-CM | POA: Diagnosis not present

## 2016-09-04 ENCOUNTER — Ambulatory Visit (HOSPITAL_COMMUNITY)
Admission: RE | Admit: 2016-09-04 | Discharge: 2016-09-04 | Disposition: A | Discharge status: Home / Self Care | Payer: Medicare Other | Source: Ambulatory Visit | Attending: Ophthalmology | Admitting: Ophthalmology

## 2016-09-04 ENCOUNTER — Encounter (HOSPITAL_COMMUNITY)
Admission: RE | Disposition: A | Discharge status: Home / Self Care | Payer: Self-pay | Source: Ambulatory Visit | Attending: Ophthalmology

## 2016-09-04 DIAGNOSIS — Z91013 Allergy to seafood: Secondary | ICD-10-CM | POA: Diagnosis not present

## 2016-09-04 DIAGNOSIS — H2640 Unspecified secondary cataract: Secondary | ICD-10-CM | POA: Diagnosis not present

## 2016-09-04 DIAGNOSIS — Z90710 Acquired absence of both cervix and uterus: Secondary | ICD-10-CM | POA: Insufficient documentation

## 2016-09-04 DIAGNOSIS — E7800 Pure hypercholesterolemia, unspecified: Secondary | ICD-10-CM | POA: Insufficient documentation

## 2016-09-04 DIAGNOSIS — Z96659 Presence of unspecified artificial knee joint: Secondary | ICD-10-CM | POA: Insufficient documentation

## 2016-09-04 DIAGNOSIS — Z79899 Other long term (current) drug therapy: Secondary | ICD-10-CM | POA: Insufficient documentation

## 2016-09-04 DIAGNOSIS — M1990 Unspecified osteoarthritis, unspecified site: Secondary | ICD-10-CM | POA: Diagnosis not present

## 2016-09-04 HISTORY — PX: YAG LASER APPLICATION: SHX6189

## 2016-09-04 SURGERY — TREATMENT, USING YAG LASER
Anesthesia: LOCAL | Laterality: Right

## 2016-09-04 MED ORDER — TROPICAMIDE 1 % OP SOLN
1.0000 [drp] | OPHTHALMIC | Status: AC
  Administered 2016-09-04 (×3): 1 [drp] via OPHTHALMIC

## 2016-09-04 MED ORDER — TETRACAINE HCL 0.5 % OP SOLN
1.0000 [drp] | Freq: Once | OPHTHALMIC | Status: DC
Start: 2016-09-04 — End: 2016-09-06

## 2016-09-04 MED ORDER — TETRACAINE HCL 0.5 % OP SOLN
OPHTHALMIC | Status: AC
  Filled 2016-09-04: qty 4

## 2016-09-04 MED ORDER — TROPICAMIDE 1 % OP SOLN
OPHTHALMIC | Status: AC
  Filled 2016-09-04: qty 3

## 2016-09-04 NOTE — Brief Op Note (Signed)
Alease Medinaatricia B Mota 09/04/2016  Susa Simmondsarroll F Tyneshia Stivers, MD  Pre-op Diagnosis:  secondary cataract right eye  Post-op Diagnosis: same  Yag laser self-test completed: Yes.    Indications:  Unable to drive at night  Procedure:   YAG posterior capsulotomy  OD    Eye protection worn by staff:  Yes.   Laser In Use sign on door:  Yes.    Laser:  {LUMENIS YAG/SLT LASER  Power Setting:  1.7 mJ/burst Anatomical site treated:  Posterior capsule OD Number of applications:  27 Total energy delivered: 46.25 mJ Results:  Cleared visual axis  The patient was discharged home with instructions to continue all her current glaucoma medications in the un-operated eye, and discontinue all her current glaucoma medications, if any.  Patient was instructed to go to the office, as previously scheduled, for intraocular pressure:  No.  Patient verbalizes understanding of discharge instructions:  Yes.    Notes:  Pt tolerated procedure well, no complications

## 2016-09-04 NOTE — H&P (Signed)
I have reviewed the pre printed H&P, the patient was re-examined, and I have identified no significant interval changes in the patient's medical condition.  There is no change in the plan of care since the history and physical of record. 

## 2016-09-04 NOTE — Discharge Instructions (Signed)
Alease Medinaatricia B Stefan  09/04/2016     Instructions    Activity: No Restrictions.   Diet: Resume Diet you were on at home.   Pain Medication: Tylenol if Needed.   CONTACT YOUR DOCTOR IF YOU HAVE PAIN, REDNESS IN YOUR EYE, OR DECREASED VISION.   Follow-up:in 3 days with Susa Simmondsarroll F Haines, MD.   Dr. Nile RiggsShapiro: [phone]  Dr. Lita MainsHaines: [phone]: [phone]  Dr. Alto DenverHunt: [phone]: [phone]   If you find that you cannot contact your physician, but feel that your signs and   Symptoms warrant a physician's attention, call the Emergency Room at   [phone].   Othern/a.  FOLLOW UP WITH DR HAINES ON 09/08/2016 @ 7:50 AM

## 2016-09-07 DIAGNOSIS — H26491 Other secondary cataract, right eye: Secondary | ICD-10-CM | POA: Diagnosis not present

## 2016-09-07 DIAGNOSIS — H538 Other visual disturbances: Secondary | ICD-10-CM | POA: Diagnosis not present

## 2016-09-08 ENCOUNTER — Encounter (HOSPITAL_COMMUNITY): Payer: Self-pay | Admitting: Ophthalmology

## 2016-09-08 DIAGNOSIS — L821 Other seborrheic keratosis: Secondary | ICD-10-CM | POA: Diagnosis not present

## 2016-09-08 DIAGNOSIS — Z299 Encounter for prophylactic measures, unspecified: Secondary | ICD-10-CM | POA: Diagnosis not present

## 2016-09-08 DIAGNOSIS — Z789 Other specified health status: Secondary | ICD-10-CM | POA: Diagnosis not present

## 2016-09-08 DIAGNOSIS — L820 Inflamed seborrheic keratosis: Secondary | ICD-10-CM | POA: Diagnosis not present

## 2016-09-21 DIAGNOSIS — Z23 Encounter for immunization: Secondary | ICD-10-CM | POA: Diagnosis not present

## 2016-09-24 DIAGNOSIS — E8940 Asymptomatic postprocedural ovarian failure: Secondary | ICD-10-CM | POA: Diagnosis not present

## 2016-10-21 DIAGNOSIS — M1712 Unilateral primary osteoarthritis, left knee: Secondary | ICD-10-CM | POA: Diagnosis not present

## 2016-10-27 ENCOUNTER — Ambulatory Visit: Payer: Self-pay | Admitting: Orthopedic Surgery

## 2016-10-27 IMAGING — DX DG KNEE 1-2V PORT*R*
2 series · 2 of 2 positions shown · non-contrast
Comparison: None.

CLINICAL DATA: Right knee replacement

EXAM:
PORTABLE RIGHT KNEE - 1-2 VIEW

[knee ap]
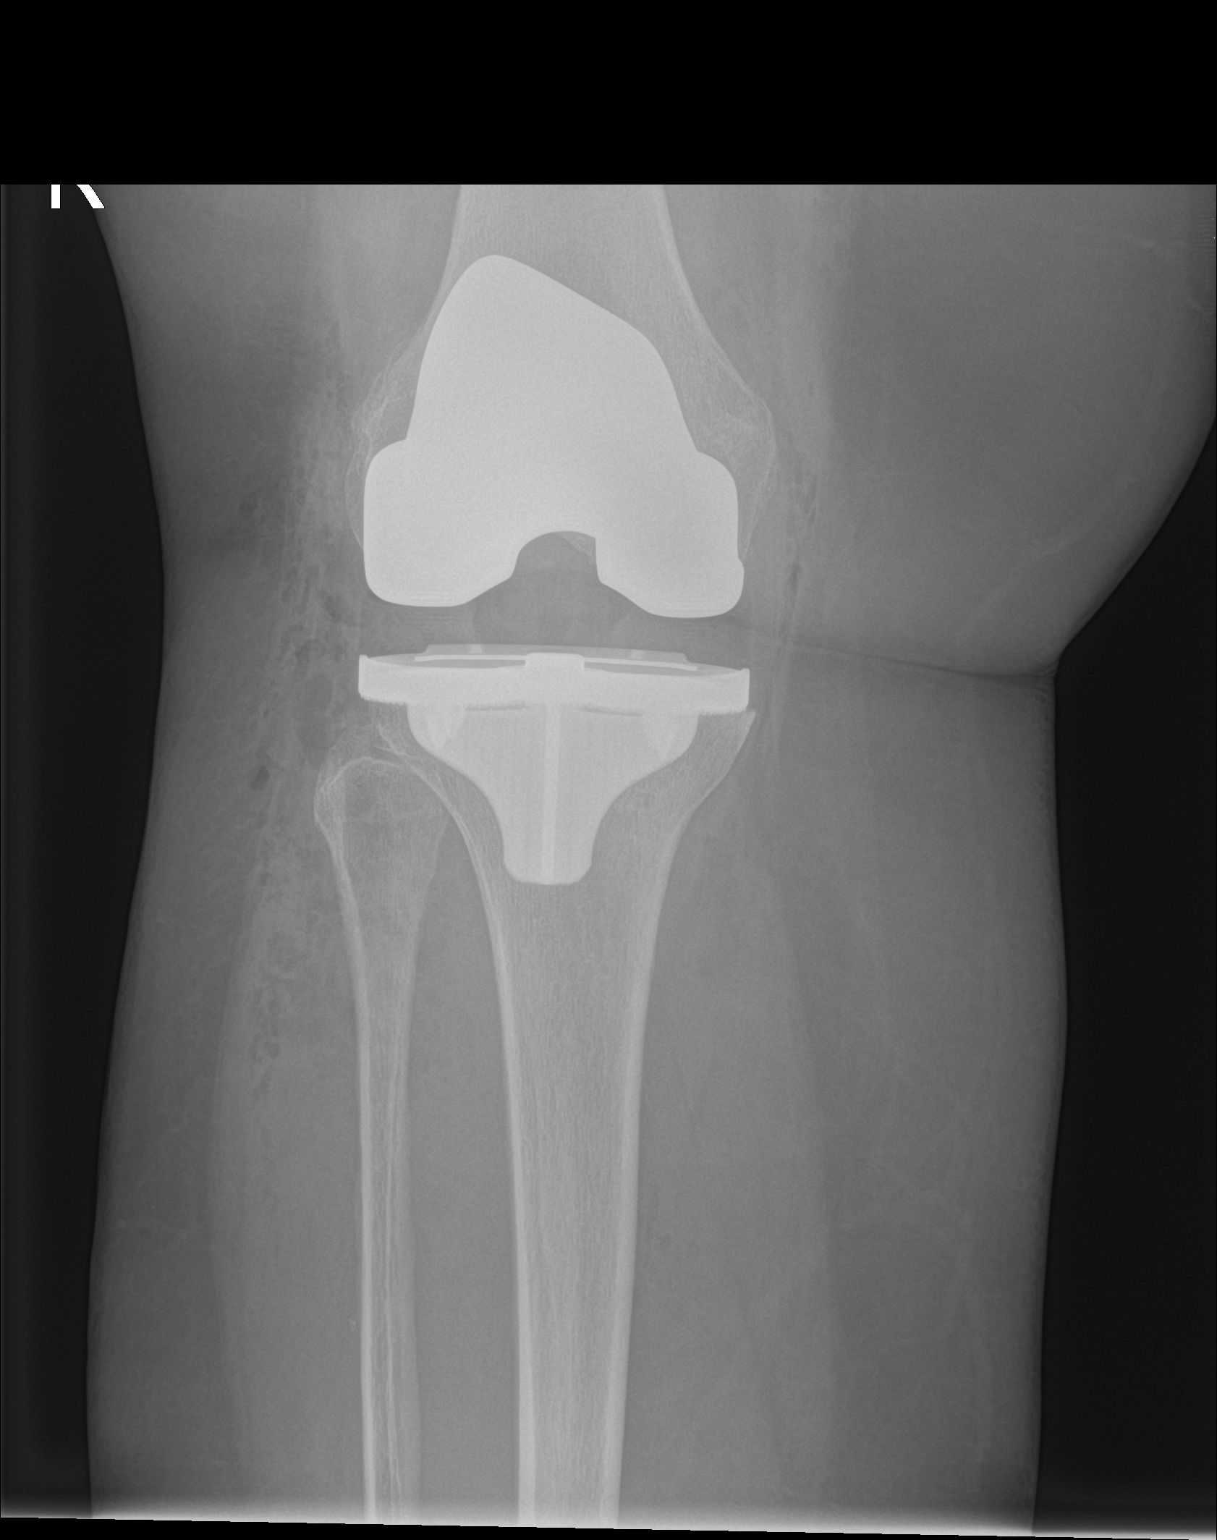

[knee lat]
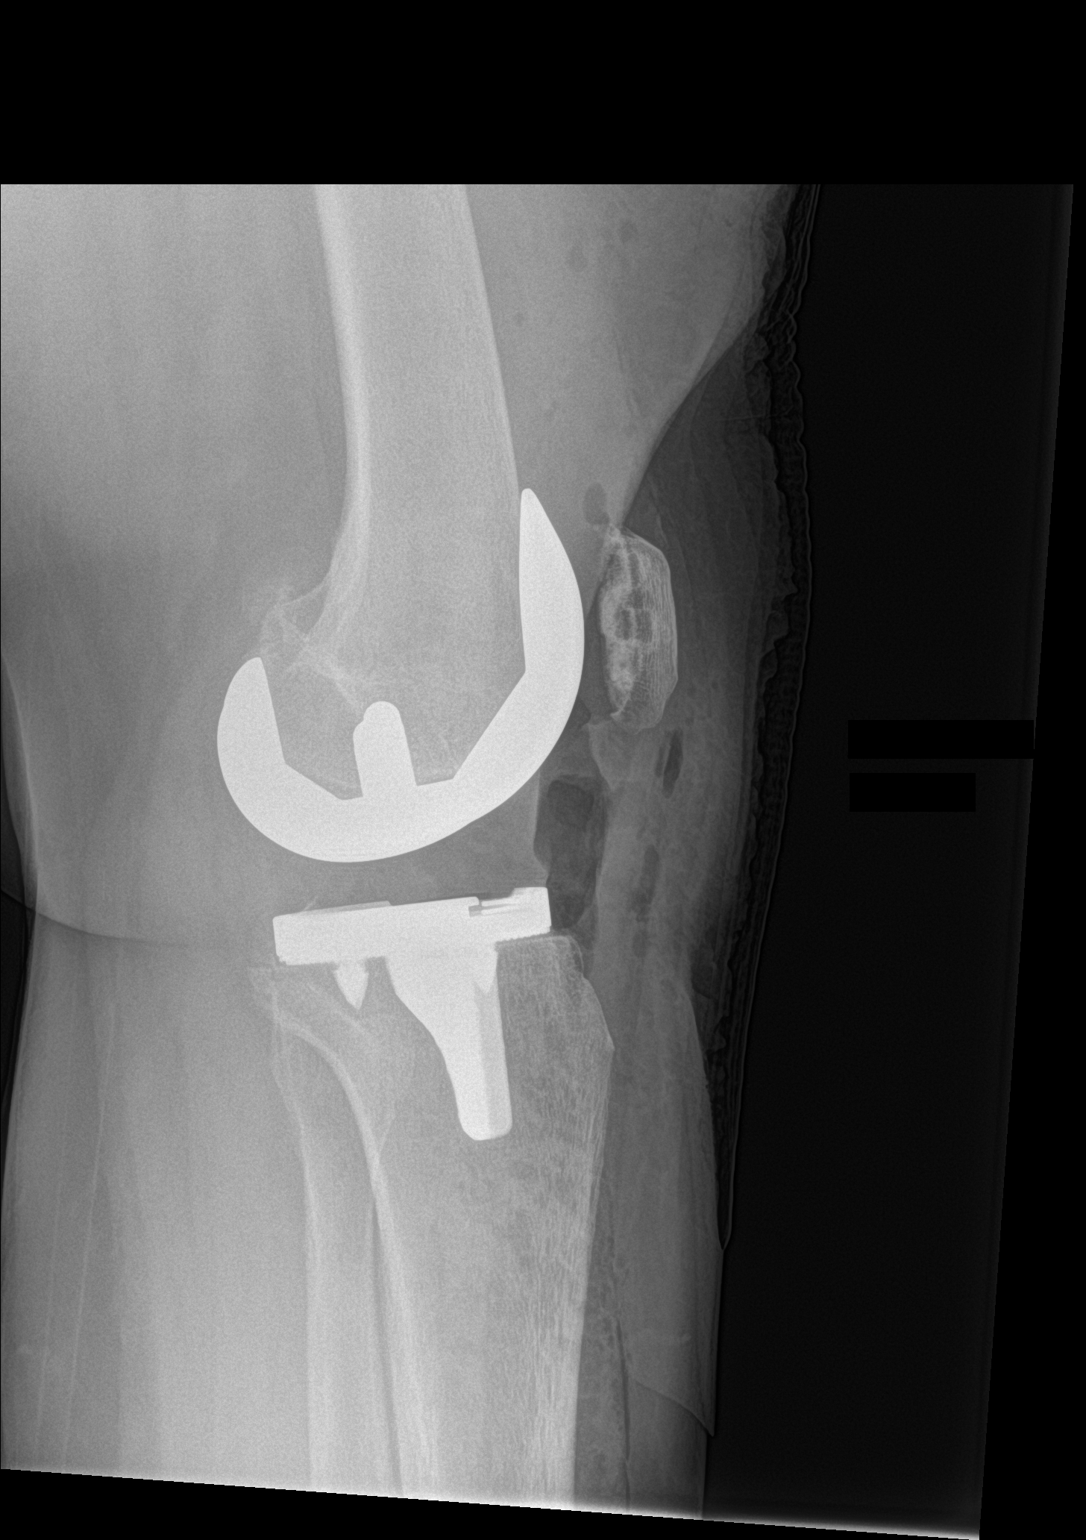

[2 of 2 positions shown; findings below may reference images not displayed]

FINDINGS: Changes of right knee replacement. Soft tissue and joint space gas.
No hardware or bony complicating feature.
IMPRESSION: Right knee replacement.  No complicating feature.

## 2016-11-19 ENCOUNTER — Encounter (HOSPITAL_COMMUNITY)
Admission: RE | Admit: 2016-11-19 | Discharge: 2016-11-19 | Disposition: A | Discharge status: Home / Self Care | Payer: Medicare Other | Source: Ambulatory Visit | Attending: Orthopedic Surgery | Admitting: Orthopedic Surgery

## 2016-11-19 ENCOUNTER — Encounter (HOSPITAL_COMMUNITY): Payer: Self-pay

## 2016-11-19 ENCOUNTER — Ambulatory Visit: Payer: Self-pay | Admitting: Orthopedic Surgery

## 2016-11-19 DIAGNOSIS — Z86010 Personal history of colonic polyps: Secondary | ICD-10-CM | POA: Insufficient documentation

## 2016-11-19 DIAGNOSIS — Z01812 Encounter for preprocedural laboratory examination: Secondary | ICD-10-CM | POA: Diagnosis not present

## 2016-11-19 DIAGNOSIS — K219 Gastro-esophageal reflux disease without esophagitis: Secondary | ICD-10-CM | POA: Insufficient documentation

## 2016-11-19 DIAGNOSIS — I10 Essential (primary) hypertension: Secondary | ICD-10-CM | POA: Insufficient documentation

## 2016-11-19 DIAGNOSIS — E785 Hyperlipidemia, unspecified: Secondary | ICD-10-CM | POA: Insufficient documentation

## 2016-11-19 DIAGNOSIS — M1711 Unilateral primary osteoarthritis, right knee: Secondary | ICD-10-CM | POA: Insufficient documentation

## 2016-11-19 LAB — TYPE AND SCREEN
ABO/RH(D): O POS
Antibody Screen: NEGATIVE

## 2016-11-19 LAB — BASIC METABOLIC PANEL
Anion gap: 9 (ref 5–15)
BUN: 14 mg/dL (ref 6–20)
CALCIUM: 9.8 mg/dL (ref 8.9–10.3)
CHLORIDE: 102 mmol/L (ref 101–111)
CO2: 28 mmol/L (ref 22–32)
CREATININE: 1.26 mg/dL — AB (ref 0.44–1.00)
GFR calc non Af Amer: 41 mL/min — ABNORMAL LOW (ref >60)
GFR, EST AFRICAN AMERICAN: 48 mL/min — AB (ref >60)
GLUCOSE: 100 mg/dL — AB (ref 65–99)
Potassium: 4.2 mmol/L (ref 3.5–5.1)
Sodium: 139 mmol/L (ref 135–145)

## 2016-11-19 LAB — CBC
HEMATOCRIT: 42.5 % (ref 36.0–46.0)
HEMOGLOBIN: 13.5 g/dL (ref 12.0–15.0)
MCH: 28.6 pg (ref 26.0–34.0)
MCHC: 31.8 g/dL (ref 30.0–36.0)
MCV: 90 fL (ref 78.0–100.0)
Platelets: 236 10*3/uL (ref 150–400)
RBC: 4.72 MIL/uL (ref 3.87–5.11)
RDW: 14.3 % (ref 11.5–15.5)
WBC: 7.2 10*3/uL (ref 4.0–10.5)

## 2016-11-19 LAB — SURGICAL PCR SCREEN
MRSA, PCR: NEGATIVE
Staphylococcus aureus: NEGATIVE

## 2016-11-19 NOTE — H&P (Signed)
TOTAL KNEE ADMISSION H&P  Patient is being admitted for left total knee arthroplasty.  Subjective:  Chief Complaint:left knee pain.  HPI: Heather Cox, 71 y.o. female, has a history of pain and functional disability in the left knee due to arthritis and has failed non-surgical conservative treatments for greater than 12 weeks to includeNSAID's and/or analgesics, corticosteriod injections, flexibility and strengthening excercises, supervised PT with diminished ADL's post treatment, use of assistive devices, weight reduction as appropriate and activity modification.  Onset of symptoms was gradual, starting 5 years ago with gradually worsening course since that time. The patient noted no past surgery on the left knee(s).  Patient currently rates pain in the left knee(s) at 10 out of 10 with activity. Patient has night pain, worsening of pain with activity and weight bearing, pain that interferes with activities of daily living, pain with passive range of motion, crepitus and joint swelling.  Patient has evidence of subchondral cysts, subchondral sclerosis, periarticular osteophytes and joint space narrowing by imaging studies.  There is no active infection.  Patient Active Problem List   Diagnosis Date Noted  . Primary osteoarthritis of right knee 06/08/2016   Past Medical History:  Diagnosis Date  . Arthritis   . GERD (gastroesophageal reflux disease)    occasionally and will take Tums if needed  . History of colon polyps    benign  . Hyperlipidemia    takes Atorvastatin daily  . Hypertension    takes Atenolol daily  . Joint pain     Past Surgical History:  Procedure Laterality Date  . ABDOMINAL HYSTERECTOMY  1994  . CARDIAC CATHETERIZATION  2017  . cataract surgery Bilateral   . CESAREAN SECTION  1977  . COLONOSCOPY    . KNEE ARTHROPLASTY Right 06/08/2016   Procedure: RIGHT TOTAL KNEE ARTHROPLASTY WITH COMPUTER NAVIGATION;  Surgeon: Samson Frederic, MD;  Location: [facility] OR;  Service:  Orthopedics;  Laterality: Right;  Request RNFA   . TONSILLECTOMY     at age 70  . WRIST SURGERY Left 1988  . YAG LASER APPLICATION Left 12/30/2015   Procedure: YAG LASER APPLICATION;  Surgeon: Susa Simmonds, MD;  Location: [facility];  Service: Ophthalmology;  Laterality: Left;  rescheduled to 2/20   . YAG LASER APPLICATION Right 09/04/2016   Procedure: YAG LASER APPLICATION;  Surgeon: Susa Simmonds, MD;  Location: [facility];  Service: Ophthalmology;  Laterality: Right;     (Not in a hospital admission) Allergies  Allergen Reactions  . Shrimp [Shellfish Allergy] Anaphylaxis, Itching and Swelling    Tongue swelling, lips numb    Social History  Substance Use Topics  . Smoking status: Never Smoker  . Smokeless tobacco: Not on file  . Alcohol use No    No family history on file.   Review of Systems  Constitutional: Negative.   HENT: Negative.   Eyes: Negative.   Respiratory: Negative.   Cardiovascular: Negative.   Gastrointestinal: Negative.   Genitourinary: Negative.   Musculoskeletal: Positive for joint pain.  Skin: Negative.   Neurological: Negative.   Endo/Heme/Allergies: Negative.   Psychiatric/Behavioral: Negative.     Objective:  Physical Exam  Vitals reviewed. Constitutional: She is oriented to person, place, and time. She appears well-developed and well-nourished.  HENT:  Head: Normocephalic and atraumatic.  Eyes: Conjunctivae and EOM are normal. Pupils are equal, round, and reactive to light.  Neck: Normal range of motion. Neck supple.  Cardiovascular: Normal rate and regular rhythm.   Respiratory: Effort normal. No respiratory  distress.  GI: Soft. She exhibits no distension.  Genitourinary:  Genitourinary Comments: deferred  Musculoskeletal:       Left knee: She exhibits decreased range of motion, swelling and effusion. Tenderness found. Medial joint line and lateral joint line tenderness noted.  Neurological: She is alert and oriented to person, place,  and time. She has normal reflexes.  Skin: Skin is warm.  Psychiatric: She has a normal mood and affect. Her behavior is normal. Judgment and thought content normal.    Vital signs in last 24 hours: @VSRANGES @  Labs:   Estimated body mass index is 37.62 kg/m as calculated from the following:   Height as of 05/28/16: 5\' 2"  (1.575 m).   Weight as of 05/28/16: 93.3 kg (205 lb 11.2 oz).   Imaging Review Plain radiographs demonstrate severe degenerative joint disease of the left knee(s). The overall alignment issignificant varus. The bone quality appears to be adequate for age and reported activity level.  Assessment/Plan:  End stage arthritis, left knee   The patient history, physical examination, clinical judgment of the provider and imaging studies are consistent with end stage degenerative joint disease of the left knee(s) and total knee arthroplasty is deemed medically necessary. The treatment options including medical management, injection therapy arthroscopy and arthroplasty were discussed at length. The risks and benefits of total knee arthroplasty were presented and reviewed. The risks due to aseptic loosening, infection, stiffness, patella tracking problems, thromboembolic complications and other imponderables were discussed. The patient acknowledged the explanation, agreed to proceed with the plan and consent was signed. Patient is being admitted for inpatient treatment for surgery, pain control, PT, OT, prophylactic antibiotics, VTE prophylaxis, progressive ambulation and ADL's and discharge planning. The patient is planning to be discharged home with home health services

## 2016-11-19 NOTE — Progress Notes (Signed)
PCP is Dr. Sherril CroonVyas  Cardiologist is Dr. Abelardo DieselJeffrey Clevenger Denies any chest pain Surgery noted July 2017-her chart was reviewed by Rica MastAngela Kabbe, FNP  05-28-2016  card cath noted in care everywhere from 03-25-16 Stress test noted form 09-02-15 request sent for EKG tracing and Stress test report

## 2016-11-19 NOTE — Pre-Procedure Instructions (Signed)
Heather Cox  11/19/2016      [facility] [place], [place] - [address] [place] [address] Phone: [phone] Fax: [phone]    Your procedure is scheduled on Nov 30 2016.  [facility] at 530 A.M.  Call this number if you have problems the morning of surgery:  [phone]   Remember:  Do not eat food or drink liquids after midnight.  Take these medicines the morning of surgery with A SIP OF WATER: Atenolol (Tenormin)  Stop taking aspirin, BC's, Goody's, Herbal medications, Fish Oil, Ibuprofen, Advil, Motrin   Do not wear jewelry, make-up or nail polish.  Do not wear lotions, powders, or perfumes, or deoderant.  Do not shave 48 hours prior to surgery.  Men may shave face and neck.  Do not bring valuables to the hospital.  [facility] any belongings or valuables.  Contacts, dentures or bridgework may not be worn into surgery.  Leave your suitcase in the car.  After surgery it may be brought to your room.  For patients admitted to the hospital, discharge time will be determined by your treatment team.  Patients discharged the day of surgery will not be allowed to drive home.   Special instructions:  [facility] - Preparing for Surgery  Before surgery, you can play an important role.  Because skin is not sterile, your skin needs to be as free of germs as possible.  You can reduce the number of germs on you skin by washing with CHG (chlorahexidine gluconate) soap before surgery.  CHG is an antiseptic cleaner which kills germs and bonds with the skin to continue killing germs even after washing.  Please DO NOT use if you have an allergy to CHG or antibacterial soaps.  If your skin becomes reddened/irritated stop using the CHG and inform your nurse when you arrive at Short Stay.  Do not shave (including legs and underarms) for at least 48 hours prior to the first CHG  shower.  You may shave your face.  Please follow these instructions carefully:   1.  Shower with CHG Soap the night before surgery and the   morning of Surgery.  2.  If you choose to wash your hair, wash your hair first as usual with your   normal shampoo.  3.  After you shampoo, rinse your hair and body thoroughly to remove the Shampoo.  4.  Use CHG as you would any other liquid soap.  You can apply chg directly to the skin and wash gently with scrungie or a clean washcloth.  5.  Apply the CHG Soap to your body ONLY FROM THE NECK DOWN.    Do not use on open wounds or open sores.  Avoid contact with your eyes,       ears, mouth and genitals (private parts).  Wash genitals (private parts)  with your normal soap.  6.  Wash thoroughly, paying special attention to the area where your surgery   will be performed.  7.  Thoroughly rinse your body with warm water from the neck down.  8.  DO NOT shower/wash with your normal soap after using and rinsing off  the CHG Soap.  9.  Pat yourself dry with a clean towel.            10.  Wear clean pajamas.  11.  Place clean sheets on your bed the night of your first shower and do not  sleep with pets.  Day of Surgery  Do not apply any lotions/deoderants the morning of surgery.  Please wear clean clothes to the hospital/surgery center.     Please read over the following fact sheets that you were given. Pain Booklet, Coughing and Deep Breathing, MRSA Information and Surgical Site Infection Prevention

## 2016-11-23 ENCOUNTER — Encounter (HOSPITAL_COMMUNITY): Payer: Self-pay

## 2016-11-23 NOTE — Progress Notes (Addendum)
Anesthesia Chart Review: Patient is a 71 year old female scheduled for left TKA on 11/30/16 by Dr. Linna CapriceSwinteck.  PMH includes non-smoker, HTN, hyperlipidemia, GERD, right TKA 06/08/16 (done under [facility]), tonsillectomy, hysterectomy. Normal coronaries by cath in 2017. BMI is consistent with obesity.   PCP is Doreen Beamhruv Vyas, MD. She saw cardiologist is Abelardo DieselJeffrey Clevenger, MD in the Fall of 2016 for SOB. She abnormal (false positive) stress test which lead to a cardiac cath in May 2017 that showed normal coronaries.    Medications include atenolol, Lipitor.  BP 125/68   Pulse 77   Temp 36.8 C   Resp 20   Ht 5\' 1"  (1.549 m)   Wt 204 lb 12.8 oz (92.9 kg)   SpO2 97%   BMI 38.70 kg/m   EKG 03/17/16 [facility]; scanned under Media tab; Correspondence 06/08/16): Sinus rhythm, RSR V1 (non-diagnostic).  Cardiac cath 03/25/16 [facility]; Care Everywhere): 1.Angiographically normal coronary arteries. 2. Right radial access.  Nuclear stress test 09/02/15 [facility]): Conclusion: Abnormal stress test, stress induced anteroapical defect. Post stress LVEF 72%.  Echo 08/12/15 ([facility]): Conclusion: There is mild left ventricular hypertrophy with normal LV size. LVEF 60-65%. E to A wave reversal. Mild aortic regurgitation. Mild mitral leaflet thickening. Aortic root is grossly normal with question dilated ascending aorta. Recommend Chest CT to assess aorta.   CTA Chest 08/26/15 [facility]): Impression: 1. Normal appearance in size of the proximal aorta (proximal aorta is WNL 3.1 cm; maximal diameter cross the sinus of Valsalva 3.7 cm; aortic root WNL 2.3 cm). 2. No evidence of cardiomegaly. 3. Mild rightward curvature and degenerative changes in the thoracic spine.  Preoperative labs noted.  If no acute changes then I anticipate she can proceed with surgery as scheduled.   Velna Ochsllison Jorden Minchey, PA-C [facility]iology Phone [phone] 11/23/2016 10:54  AM

## 2016-11-27 MED ORDER — SODIUM CHLORIDE 0.9 % IV SOLN
1000.0000 mg | INTRAVENOUS | Status: AC
  Administered 2016-11-30: 1000 mg via INTRAVENOUS
  Filled 2016-11-27: qty 10

## 2016-11-30 ENCOUNTER — Inpatient Hospital Stay (HOSPITAL_COMMUNITY)
Admission: RE | Admit: 2016-11-30 | Discharge: 2016-12-02 | DRG: 470 | Disposition: A | Discharge status: Home / Self Care | Payer: Medicare Other | Source: Ambulatory Visit | Attending: Orthopedic Surgery | Admitting: Orthopedic Surgery

## 2016-11-30 ENCOUNTER — Encounter (HOSPITAL_COMMUNITY): Payer: Self-pay | Admitting: *Deleted

## 2016-11-30 ENCOUNTER — Inpatient Hospital Stay (HOSPITAL_COMMUNITY): Payer: Medicare Other

## 2016-11-30 ENCOUNTER — Inpatient Hospital Stay (HOSPITAL_COMMUNITY): Payer: Medicare Other | Admitting: Anesthesiology

## 2016-11-30 ENCOUNTER — Encounter (HOSPITAL_COMMUNITY)
Admission: RE | Disposition: A | Discharge status: Home / Self Care | Payer: Self-pay | Source: Ambulatory Visit | Attending: Orthopedic Surgery

## 2016-11-30 ENCOUNTER — Inpatient Hospital Stay (HOSPITAL_COMMUNITY): Payer: Medicare Other | Admitting: Vascular Surgery

## 2016-11-30 DIAGNOSIS — Z90710 Acquired absence of both cervix and uterus: Secondary | ICD-10-CM | POA: Diagnosis not present

## 2016-11-30 DIAGNOSIS — Z9842 Cataract extraction status, left eye: Secondary | ICD-10-CM

## 2016-11-30 DIAGNOSIS — D62 Acute posthemorrhagic anemia: Secondary | ICD-10-CM | POA: Diagnosis not present

## 2016-11-30 DIAGNOSIS — Z96651 Presence of right artificial knee joint: Secondary | ICD-10-CM | POA: Diagnosis present

## 2016-11-30 DIAGNOSIS — M1712 Unilateral primary osteoarthritis, left knee: Secondary | ICD-10-CM | POA: Diagnosis not present

## 2016-11-30 DIAGNOSIS — E785 Hyperlipidemia, unspecified: Secondary | ICD-10-CM | POA: Diagnosis present

## 2016-11-30 DIAGNOSIS — Z91013 Allergy to seafood: Secondary | ICD-10-CM | POA: Diagnosis not present

## 2016-11-30 DIAGNOSIS — K219 Gastro-esophageal reflux disease without esophagitis: Secondary | ICD-10-CM | POA: Diagnosis not present

## 2016-11-30 DIAGNOSIS — I10 Essential (primary) hypertension: Secondary | ICD-10-CM | POA: Diagnosis not present

## 2016-11-30 DIAGNOSIS — Z9841 Cataract extraction status, right eye: Secondary | ICD-10-CM

## 2016-11-30 DIAGNOSIS — Z471 Aftercare following joint replacement surgery: Secondary | ICD-10-CM | POA: Diagnosis not present

## 2016-11-30 DIAGNOSIS — Z09 Encounter for follow-up examination after completed treatment for conditions other than malignant neoplasm: Secondary | ICD-10-CM

## 2016-11-30 DIAGNOSIS — Z96652 Presence of left artificial knee joint: Secondary | ICD-10-CM | POA: Diagnosis not present

## 2016-11-30 HISTORY — PX: TOTAL KNEE ARTHROPLASTY: SHX125

## 2016-11-30 SURGERY — ARTHROPLASTY, KNEE, TOTAL
Anesthesia: General | Site: Knee | Laterality: Left

## 2016-11-30 MED ORDER — BUPIVACAINE-EPINEPHRINE (PF) 0.5% -1:200000 IJ SOLN
INTRAMUSCULAR | Status: AC
  Filled 2016-11-30: qty 30

## 2016-11-30 MED ORDER — SODIUM CHLORIDE 0.9 % IR SOLN
Status: DC | PRN
  Administered 2016-11-30: 250 mL

## 2016-11-30 MED ORDER — EPHEDRINE SULFATE 50 MG/ML IJ SOLN
INTRAMUSCULAR | Status: DC | PRN
  Administered 2016-11-30 (×4): 10 mg via INTRAVENOUS

## 2016-11-30 MED ORDER — SENNA 8.6 MG PO TABS
2.0000 | ORAL_TABLET | Freq: Every day | ORAL | Status: DC
  Administered 2016-11-30 – 2016-12-01 (×2): 17.2 mg via ORAL
  Filled 2016-11-30 (×2): qty 2

## 2016-11-30 MED ORDER — ACETAMINOPHEN 325 MG PO TABS: 650.0000 mg | ORAL_TABLET | Freq: Four times a day (QID) | ORAL | Status: DC | PRN

## 2016-11-30 MED ORDER — METOCLOPRAMIDE HCL 5 MG/ML IJ SOLN: 10.0000 mg | Freq: Once | INTRAMUSCULAR | Status: DC | PRN

## 2016-11-30 MED ORDER — ROCURONIUM BROMIDE 100 MG/10ML IV SOLN
INTRAVENOUS | Status: DC | PRN
  Administered 2016-11-30: 50 mg via INTRAVENOUS

## 2016-11-30 MED ORDER — ONDANSETRON HCL 4 MG/2ML IJ SOLN
INTRAMUSCULAR | Status: AC
  Filled 2016-11-30: qty 2

## 2016-11-30 MED ORDER — PROPOFOL 10 MG/ML IV BOLUS
INTRAVENOUS | Status: DC | PRN
  Administered 2016-11-30: 150 mg via INTRAVENOUS

## 2016-11-30 MED ORDER — 0.9 % SODIUM CHLORIDE (POUR BTL) OPTIME
TOPICAL | Status: DC | PRN
  Administered 2016-11-30: 1000 mL

## 2016-11-30 MED ORDER — DEXAMETHASONE SODIUM PHOSPHATE 10 MG/ML IJ SOLN
10.0000 mg | Freq: Once | INTRAMUSCULAR | Status: AC
  Administered 2016-12-01: 10 mg via INTRAVENOUS
  Filled 2016-11-30: qty 1

## 2016-11-30 MED ORDER — FENTANYL CITRATE (PF) 100 MCG/2ML IJ SOLN
INTRAMUSCULAR | Status: AC
  Filled 2016-11-30: qty 2

## 2016-11-30 MED ORDER — KETOROLAC TROMETHAMINE 15 MG/ML IJ SOLN
15.0000 mg | Freq: Four times a day (QID) | INTRAMUSCULAR | Status: AC
  Administered 2016-11-30 – 2016-12-01 (×3): 15 mg via INTRAVENOUS
  Filled 2016-11-30 (×4): qty 1

## 2016-11-30 MED ORDER — PROPOFOL 10 MG/ML IV BOLUS
INTRAVENOUS | Status: AC
  Filled 2016-11-30: qty 20

## 2016-11-30 MED ORDER — ALUM & MAG HYDROXIDE-SIMETH 200-200-20 MG/5ML PO SUSP: 30.0000 mL | ORAL | Status: DC | PRN

## 2016-11-30 MED ORDER — ATENOLOL 50 MG PO TABS
25.0000 mg | ORAL_TABLET | Freq: Every day | ORAL | Status: DC
  Administered 2016-12-01 – 2016-12-02 (×2): 25 mg via ORAL
  Filled 2016-11-30 (×2): qty 1

## 2016-11-30 MED ORDER — HYDROCODONE-ACETAMINOPHEN 5-325 MG PO TABS
ORAL_TABLET | ORAL | Status: AC
Start: 2016-11-30 — End: 2016-12-01
  Filled 2016-11-30: qty 2

## 2016-11-30 MED ORDER — CEFAZOLIN SODIUM-DEXTROSE 2-4 GM/100ML-% IV SOLN
2.0000 g | INTRAVENOUS | Status: AC
  Administered 2016-11-30: 2 g via INTRAVENOUS

## 2016-11-30 MED ORDER — CEFAZOLIN SODIUM-DEXTROSE 2-4 GM/100ML-% IV SOLN
2.0000 g | Freq: Four times a day (QID) | INTRAVENOUS | Status: AC
  Administered 2016-11-30 (×2): 2 g via INTRAVENOUS
  Filled 2016-11-30 (×2): qty 100

## 2016-11-30 MED ORDER — POVIDONE-IODINE 10 % EX SWAB: 2.0000 "application " | Freq: Once | CUTANEOUS | Status: DC

## 2016-11-30 MED ORDER — POLYETHYLENE GLYCOL 3350 17 G PO PACK: 17.0000 g | PACK | Freq: Every day | ORAL | Status: DC | PRN

## 2016-11-30 MED ORDER — MENTHOL 3 MG MT LOZG: 1.0000 | LOZENGE | OROMUCOSAL | Status: DC | PRN

## 2016-11-30 MED ORDER — LIDOCAINE 2% (20 MG/ML) 5 ML SYRINGE
INTRAMUSCULAR | Status: AC
  Filled 2016-11-30: qty 5

## 2016-11-30 MED ORDER — METHOCARBAMOL 1000 MG/10ML IJ SOLN
500.0000 mg | Freq: Four times a day (QID) | INTRAVENOUS | Status: DC | PRN
  Filled 2016-11-30: qty 5

## 2016-11-30 MED ORDER — LIDOCAINE HCL (CARDIAC) 20 MG/ML IV SOLN
INTRAVENOUS | Status: DC | PRN
  Administered 2016-11-30: 100 mg via INTRAVENOUS

## 2016-11-30 MED ORDER — ONDANSETRON HCL 4 MG/2ML IJ SOLN: 4.0000 mg | Freq: Four times a day (QID) | INTRAMUSCULAR | Status: DC | PRN

## 2016-11-30 MED ORDER — DOCUSATE SODIUM 100 MG PO CAPS
100.0000 mg | ORAL_CAPSULE | Freq: Two times a day (BID) | ORAL | Status: DC
  Administered 2016-11-30 – 2016-12-02 (×4): 100 mg via ORAL
  Filled 2016-11-30 (×4): qty 1

## 2016-11-30 MED ORDER — EPHEDRINE 5 MG/ML INJ
INTRAVENOUS | Status: AC
  Filled 2016-11-30: qty 10

## 2016-11-30 MED ORDER — FENTANYL CITRATE (PF) 100 MCG/2ML IJ SOLN
INTRAMUSCULAR | Status: AC
  Filled 2016-11-30: qty 4

## 2016-11-30 MED ORDER — SUGAMMADEX SODIUM 200 MG/2ML IV SOLN
INTRAVENOUS | Status: AC
  Filled 2016-11-30: qty 2

## 2016-11-30 MED ORDER — SODIUM CHLORIDE 0.9 % IV SOLN: INTRAVENOUS | Status: DC

## 2016-11-30 MED ORDER — FENTANYL CITRATE (PF) 100 MCG/2ML IJ SOLN
25.0000 ug | INTRAMUSCULAR | Status: DC | PRN
  Administered 2016-11-30: 50 ug via INTRAVENOUS

## 2016-11-30 MED ORDER — BUPIVACAINE-EPINEPHRINE (PF) 0.5% -1:200000 IJ SOLN
INTRAMUSCULAR | Status: DC | PRN
  Administered 2016-11-30: 30 mL

## 2016-11-30 MED ORDER — ASPIRIN 81 MG PO CHEW
81.0000 mg | CHEWABLE_TABLET | Freq: Two times a day (BID) | ORAL | Status: DC
  Administered 2016-11-30 – 2016-12-02 (×4): 81 mg via ORAL
  Filled 2016-11-30 (×4): qty 1

## 2016-11-30 MED ORDER — SODIUM CHLORIDE 0.9 % IR SOLN
Status: DC | PRN
  Administered 2016-11-30: 3000 mL

## 2016-11-30 MED ORDER — SODIUM CHLORIDE 0.9 % IV SOLN
1000.0000 mg | Freq: Once | INTRAVENOUS | Status: AC
  Administered 2016-11-30: 1000 mg via INTRAVENOUS
  Filled 2016-11-30: qty 10

## 2016-11-30 MED ORDER — HYDROMORPHONE HCL 2 MG/ML IJ SOLN: 1.0000 mg | INTRAMUSCULAR | Status: DC | PRN

## 2016-11-30 MED ORDER — DIPHENHYDRAMINE HCL 12.5 MG/5ML PO ELIX: 12.5000 mg | ORAL_SOLUTION | ORAL | Status: DC | PRN

## 2016-11-30 MED ORDER — SODIUM CHLORIDE 0.9 % IJ SOLN: INTRAMUSCULAR | Status: DC | PRN

## 2016-11-30 MED ORDER — KETOROLAC TROMETHAMINE 30 MG/ML IJ SOLN
INTRAMUSCULAR | Status: AC
  Filled 2016-11-30: qty 1

## 2016-11-30 MED ORDER — PHENYLEPHRINE HCL 10 MG/ML IJ SOLN
INTRAVENOUS | Status: DC | PRN
  Administered 2016-11-30: 30 ug/min via INTRAVENOUS

## 2016-11-30 MED ORDER — ACETAMINOPHEN 650 MG RE SUPP: 650.0000 mg | Freq: Four times a day (QID) | RECTAL | Status: DC | PRN

## 2016-11-30 MED ORDER — CHLORHEXIDINE GLUCONATE 4 % EX LIQD: 60.0000 mL | Freq: Once | CUTANEOUS | Status: DC

## 2016-11-30 MED ORDER — METHOCARBAMOL 500 MG PO TABS
500.0000 mg | ORAL_TABLET | Freq: Four times a day (QID) | ORAL | Status: DC | PRN
  Administered 2016-11-30 – 2016-12-01 (×2): 500 mg via ORAL
  Filled 2016-11-30 (×2): qty 1

## 2016-11-30 MED ORDER — SUGAMMADEX SODIUM 200 MG/2ML IV SOLN
INTRAVENOUS | Status: DC | PRN
  Administered 2016-11-30: 190 mg via INTRAVENOUS

## 2016-11-30 MED ORDER — CALCIUM CARBONATE ANTACID 500 MG PO CHEW: 1.0000 | CHEWABLE_TABLET | Freq: Every day | ORAL | Status: DC | PRN

## 2016-11-30 MED ORDER — ROCURONIUM BROMIDE 50 MG/5ML IV SOSY
PREFILLED_SYRINGE | INTRAVENOUS | Status: AC
  Filled 2016-11-30: qty 5

## 2016-11-30 MED ORDER — ALBUMIN HUMAN 5 % IV SOLN
INTRAVENOUS | Status: DC | PRN
  Administered 2016-11-30: 09:00:00 via INTRAVENOUS

## 2016-11-30 MED ORDER — KETOROLAC TROMETHAMINE 30 MG/ML IJ SOLN
INTRAMUSCULAR | Status: DC | PRN
  Administered 2016-11-30: 30 mg via INTRA_ARTICULAR

## 2016-11-30 MED ORDER — PHENYLEPHRINE HCL 10 MG/ML IJ SOLN: INTRAMUSCULAR | Status: DC | PRN

## 2016-11-30 MED ORDER — PHENOL 1.4 % MT LIQD: 1.0000 | OROMUCOSAL | Status: DC | PRN

## 2016-11-30 MED ORDER — SODIUM CHLORIDE 0.9 % IJ SOLN
INTRAMUSCULAR | Status: DC | PRN
  Administered 2016-11-30: 30 mL

## 2016-11-30 MED ORDER — LACTATED RINGERS IV SOLN
INTRAVENOUS | Status: DC | PRN
  Administered 2016-11-30 (×3): via INTRAVENOUS

## 2016-11-30 MED ORDER — ATORVASTATIN CALCIUM 10 MG PO TABS
10.0000 mg | ORAL_TABLET | Freq: Every day | ORAL | Status: DC
  Administered 2016-11-30 – 2016-12-01 (×2): 10 mg via ORAL
  Filled 2016-11-30 (×2): qty 1

## 2016-11-30 MED ORDER — METOCLOPRAMIDE HCL 5 MG/ML IJ SOLN: 5.0000 mg | Freq: Three times a day (TID) | INTRAMUSCULAR | Status: DC | PRN

## 2016-11-30 MED ORDER — FENTANYL CITRATE (PF) 100 MCG/2ML IJ SOLN
INTRAMUSCULAR | Status: DC | PRN
  Administered 2016-11-30 (×8): 50 ug via INTRAVENOUS

## 2016-11-30 MED ORDER — SODIUM CHLORIDE 0.9 % IV SOLN
INTRAVENOUS | Status: DC
  Administered 2016-12-01: 03:00:00 via INTRAVENOUS

## 2016-11-30 MED ORDER — MEPERIDINE HCL 25 MG/ML IJ SOLN: 6.2500 mg | INTRAMUSCULAR | Status: DC | PRN

## 2016-11-30 MED ORDER — ONDANSETRON HCL 4 MG/2ML IJ SOLN
INTRAMUSCULAR | Status: DC | PRN
  Administered 2016-11-30: 4 mg via INTRAVENOUS

## 2016-11-30 MED ORDER — METOCLOPRAMIDE HCL 5 MG PO TABS: 5.0000 mg | ORAL_TABLET | Freq: Three times a day (TID) | ORAL | Status: DC | PRN

## 2016-11-30 MED ORDER — ONDANSETRON HCL 4 MG PO TABS
4.0000 mg | ORAL_TABLET | Freq: Four times a day (QID) | ORAL | Status: DC | PRN
Start: 2016-11-30 — End: 2016-12-02

## 2016-11-30 MED ORDER — METHOCARBAMOL 1000 MG/10ML IJ SOLN
500.0000 mg | INTRAVENOUS | Status: AC
  Administered 2016-11-30: 500 mg via INTRAVENOUS
  Filled 2016-11-30: qty 5

## 2016-11-30 MED ORDER — HYDROCODONE-ACETAMINOPHEN 5-325 MG PO TABS
1.0000 | ORAL_TABLET | ORAL | Status: DC | PRN
  Administered 2016-11-30 – 2016-12-02 (×10): 2 via ORAL
  Filled 2016-11-30 (×9): qty 2

## 2016-11-30 MED ORDER — ACETAMINOPHEN 10 MG/ML IV SOLN: 1000.0000 mg | INTRAVENOUS | Status: DC

## 2016-11-30 SURGICAL SUPPLY — 67 items
ALCOHOL ISOPROPYL (RUBBING) (MISCELLANEOUS) ×3 IMPLANT
BANDAGE ACE 6X5 VEL STRL LF (GAUZE/BANDAGES/DRESSINGS) ×3 IMPLANT
BANDAGE ELASTIC 6 VELCRO ST LF (GAUZE/BANDAGES/DRESSINGS) ×3 IMPLANT
BLADE SAW RECIP 87.9 MT (BLADE) ×3 IMPLANT
BNDG COHESIVE 6X5 TAN STRL LF (GAUZE/BANDAGES/DRESSINGS) ×3 IMPLANT
BONE CEMENT SIMPLEX TOBRAMYCIN (Cement) ×2 IMPLANT
CAPT KNEE TRIATH TK-4 ×3 IMPLANT
CEMENT BONE SIMPLEX TOBRAMYCIN (Cement) ×1 IMPLANT
CHLORAPREP W/TINT 26ML (MISCELLANEOUS) ×6 IMPLANT
COVER SURGICAL LIGHT HANDLE (MISCELLANEOUS) ×3 IMPLANT
CUFF TOURNIQUET SINGLE 34IN LL (TOURNIQUET CUFF) ×6 IMPLANT
DECANTER SPIKE VIAL GLASS SM (MISCELLANEOUS) ×3 IMPLANT
DERMABOND ADVANCED (GAUZE/BANDAGES/DRESSINGS) ×4
DERMABOND ADVANCED .7 DNX12 (GAUZE/BANDAGES/DRESSINGS) ×2 IMPLANT
DRAIN HEMOVAC 7FR (DRAIN) IMPLANT
DRAPE EXTREMITY T 121X128X90 (DRAPE) ×3 IMPLANT
DRAPE SHEET LG 3/4 BI-LAMINATE (DRAPES) ×6 IMPLANT
DRAPE U-SHAPE 47X51 STRL (DRAPES) ×3 IMPLANT
DRAPE UNIVERSAL PACK (DRAPES) ×3 IMPLANT
DRSG AQUACEL AG ADV 3.5X10 (GAUZE/BANDAGES/DRESSINGS) ×3 IMPLANT
DRSG TEGADERM 2-3/8X2-3/4 SM (GAUZE/BANDAGES/DRESSINGS) ×3 IMPLANT
ELECT BLADE 4.0 EZ CLEAN MEGAD (MISCELLANEOUS) ×3
ELECT CAUTERY BLADE 6.4 (BLADE) ×3 IMPLANT
ELECT REM PT RETURN 9FT ADLT (ELECTROSURGICAL) ×3
ELECTRODE BLDE 4.0 EZ CLN MEGD (MISCELLANEOUS) ×1 IMPLANT
ELECTRODE REM PT RTRN 9FT ADLT (ELECTROSURGICAL) ×1 IMPLANT
EVACUATOR 1/8 PVC DRAIN (DRAIN) IMPLANT
GLOVE BIO SURGEON STRL SZ8.5 (GLOVE) ×9 IMPLANT
GLOVE BIOGEL PI IND STRL 6.5 (GLOVE) ×1 IMPLANT
GLOVE BIOGEL PI IND STRL 8.5 (GLOVE) ×1 IMPLANT
GLOVE BIOGEL PI INDICATOR 6.5 (GLOVE) ×2
GLOVE BIOGEL PI INDICATOR 8.5 (GLOVE) ×2
GLOVE ECLIPSE 6.0 STRL STRAW (GLOVE) ×6 IMPLANT
GLOVE SURG SS PI 6.5 STRL IVOR (GLOVE) ×3 IMPLANT
GLOVE SURG SS PI 7.0 STRL IVOR (GLOVE) ×3 IMPLANT
GLOVE SURG SS PI 7.5 STRL IVOR (GLOVE) ×3 IMPLANT
GOWN STRL REUS W/ TWL LRG LVL3 (GOWN DISPOSABLE) ×3 IMPLANT
GOWN STRL REUS W/TWL 2XL LVL3 (GOWN DISPOSABLE) ×3 IMPLANT
GOWN STRL REUS W/TWL LRG LVL3 (GOWN DISPOSABLE) ×6
HANDPIECE INTERPULSE COAX TIP (DISPOSABLE) ×2
HOOD PEEL AWAY FLYTE STAYCOOL (MISCELLANEOUS) ×6 IMPLANT
KIT BASIN OR (CUSTOM PROCEDURE TRAY) ×3 IMPLANT
KNEE PATELLA ASYMMETRIC 10X32 (Knees) ×3 IMPLANT
MANIFOLD NEPTUNE II (INSTRUMENTS) ×3 IMPLANT
MARKER SKIN DUAL TIP RULER LAB (MISCELLANEOUS) ×3 IMPLANT
NEEDLE 18GX1X1/2 (RX/OR ONLY) (NEEDLE) ×3 IMPLANT
NEEDLE SPNL 18GX3.5 QUINCKE PK (NEEDLE) ×6 IMPLANT
PACK TOTAL JOINT (CUSTOM PROCEDURE TRAY) ×3 IMPLANT
PACK TOTAL KNEE CUSTOM (KITS) ×3 IMPLANT
PADDING CAST COTTON 6X4 STRL (CAST SUPPLIES) ×3 IMPLANT
PATELLA 32MMX10MM (Knees) ×3 IMPLANT
SAW OSC TIP CART 19.5X105X1.3 (SAW) ×3 IMPLANT
SEALER BIPOLAR AQUA 6.0 (INSTRUMENTS) ×3 IMPLANT
SET HNDPC FAN SPRY TIP SCT (DISPOSABLE) ×1 IMPLANT
SET PAD KNEE POSITIONER (MISCELLANEOUS) ×3 IMPLANT
SUT MNCRL AB 3-0 PS2 18 (SUTURE) ×3 IMPLANT
SUT MNCRL AB 3-0 PS2 27 (SUTURE) ×3 IMPLANT
SUT MON AB 2-0 CT1 36 (SUTURE) ×6 IMPLANT
SUT VIC AB 1 CTX 27 (SUTURE) ×3 IMPLANT
SUT VIC AB 2-0 CT1 27 (SUTURE) ×2
SUT VIC AB 2-0 CT1 TAPERPNT 27 (SUTURE) ×1 IMPLANT
SUT VLOC 180 0 24IN GS25 (SUTURE) ×3 IMPLANT
SYR 3ML LL SCALE MARK (SYRINGE) ×3 IMPLANT
SYR 50ML LL SCALE MARK (SYRINGE) ×6 IMPLANT
TOWER CARTRIDGE SMART MIX (DISPOSABLE) ×3 IMPLANT
TRAY CATH 16FR W/PLASTIC CATH (SET/KITS/TRAYS/PACK) ×3 IMPLANT
WRAP KNEE MAXI GEL POST OP (GAUZE/BANDAGES/DRESSINGS) ×3 IMPLANT

## 2016-11-30 NOTE — Anesthesia Procedure Notes (Signed)
Procedure Name: Intubation Date/Time: 11/30/2016 7:43 AM Performed by: Rosiland OzMEYERS, Siena Poehler Pre-anesthesia Checklist: Patient identified, Emergency Drugs available, Suction available, Patient being monitored and Timeout performed Patient Re-evaluated:Patient Re-evaluated prior to inductionOxygen Delivery Method: Circle system utilized Preoxygenation: Pre-oxygenation with 100% oxygen Intubation Type: IV induction Ventilation: Mask ventilation without difficulty Laryngoscope Size: Miller and 3 Grade View: Grade II Tube type: Oral Tube size: 7.5 mm Number of attempts: 1 Airway Equipment and Method: Stylet Placement Confirmation: ETT inserted through vocal cords under direct vision,  positive ETCO2 and breath sounds checked- equal and bilateral Secured at: 19 cm Tube secured with: Tape Dental Injury: Teeth and Oropharynx as per pre-operative assessment

## 2016-11-30 NOTE — Transfer of Care (Signed)
Immediate Anesthesia Transfer of Care Note  Patient: Heather Cox  Procedure(s) Performed: Procedure(s): TOTAL KNEE ARTHROPLASTY with Navigation (Left)  Patient Location: PACU  Anesthesia Type:General  Level of Consciousness: awake and patient cooperative  Airway & Oxygen Therapy: Patient Spontanous Breathing and Patient connected to face mask oxygen  Post-op Assessment: Report given to RN and Post -op Vital signs reviewed and stable  Post vital signs: Reviewed and stable  Last Vitals:  Vitals:   11/30/16 0553  BP: (!) 149/90  Pulse: 66  Resp: 20  Temp: 36.6 C    Last Pain:  Vitals:   11/30/16 0553  TempSrc: Oral         Complications: No apparent anesthesia complications

## 2016-11-30 NOTE — Anesthesia Postprocedure Evaluation (Signed)
Anesthesia Post Note  Patient: Heather Cox  Procedure(s) Performed: Procedure(s) (LRB): TOTAL KNEE ARTHROPLASTY with Navigation (Left)  Patient location during evaluation: PACU Anesthesia Type: General Level of consciousness: awake and alert Pain management: pain level controlled Vital Signs Assessment: post-procedure vital signs reviewed and stable Respiratory status: spontaneous breathing, nonlabored ventilation, respiratory function stable and patient connected to nasal cannula oxygen Cardiovascular status: blood pressure returned to baseline and stable Postop Assessment: no signs of nausea or vomiting Anesthetic complications: no       Last Vitals:  Vitals:   11/30/16 1300 11/30/16 1330  BP: 110/67 112/72  Pulse: 66 65  Resp: (!) 9 (!) 9  Temp:      Last Pain:  Vitals:   11/30/16 1345  TempSrc:   PainSc: Asleep                 Phillips Groutarignan, Kieon Lawhorn

## 2016-11-30 NOTE — Op Note (Signed)
OPERATIVE REPORT  SURGEON: Samson FredericBrian Janann Boeve, MD   ASSISTANT: Hart CarwinJustin Queen, RNFA.  PREOPERATIVE DIAGNOSIS: Left knee arthritis.   POSTOPERATIVE DIAGNOSIS: Left knee arthritis.   PROCEDURE: Left total knee arthroplasty.   IMPLANTS: Stryker Triathlon CR femur, size 4. Stryker Tritanium tibia, size 4. X3 polyethelyene insert, size 11 mm, CR. 3 button asymmetric patella, size 32 mm. Simplex P bone cement x1 pack.  ANESTHESIA:  General  TOURNIQUET TIME: Not utilized.   ESTIMATED BLOOD LOSS: 400 mL.  ANTIBIOTICS: 2 g Ancef.  DRAINS: None.  COMPLICATIONS: None   CONDITION: PACU - hemodynamically stable.   BRIEF CLINICAL NOTE: Heather Cox is a 71 y.o. female with a long-standing history of Left knee arthritis. After failing conservative management, the patient was indicated for total knee arthroplasty. The risks, benefits, and alternatives to the procedure were explained, and the patient elected to proceed.  PROCEDURE IN DETAIL: The surgical site was marked in the pre-op holding area. Once inside the operative room, general anesthesia was obtained. The patient was then positioned, a nonsterile tourniquet was placed, and the lower extremity was prepped and draped in the normal sterile surgical fashion. A time-out was called verifying side and site of surgery. The patient received IV antibiotics within 60 minutes of beginning the procedure. The tourniquet was not utilized.  An anterior approach to the knee was performed utilizing a midvastus arthrotomy. A medial release was performed and the patellar fat pad was excised. Stryker navigation was used to cut the distal femur perpendicular to the mechanical axis. A freehand patellar resection was performed, and the patella was sized an prepared with 3 lug holes.  Nagivation was used to make a neutral proximal tibia resection, taking 7 mm  of bone from the less affected lateral side with 3 degrees of slope. The menisci were excised. A spacer block was placed, and the alignment and balance in extension were confirmed.   The distal femur was sized using the 3-degree external rotation guide referencing the posterior femoral cortex. The appropriate 4-in-1 cutting block was pinned into place. Rotation was checked using Whiteside's line, the epicondylar axis, and then confirmed with a spacer block in flexion. The remaining femoral cuts were performed, taking care to protect the MCL.  The tibia was sized and the trial tray was pinned into place. The remaining trail components were inserted. The knee was stable to varus and valgus stress through a full range of motion. The patella tracked centrally, and the PCL was well balanced. The trial components were removed, and the proximal tibial surface was prepared. Final components were impacted into place. I elected to cement the patellar component, and excess cement was cleared. Once the cement polymerized, the knee was tested for a final time and found to be well balanced.  The wound was copiously irrigated with a dilute betadine solution followed by normal saline with pulse lavage. Marcaine solution was injected into the periarticular soft tissue. The wound was closed in layers using #1 Vicryl and V-Loc for the fascia, 2-0 Vicryl for the subcutaneous fat, 2-0 Monocryl for the deep dermal layer, 3-0 running Monocryl subcuticular Stitch, and Dermabond for the skin. Once the glue was fully dried, an Aquacell Ag and compressive dressing were applied. Tthe patient was transported to the recovery room in stable condition. Sponge, needle, and instrument counts were correct at the end of the case x2. The patient tolerated the procedure well and there were no known complications.

## 2016-11-30 NOTE — Anesthesia Preprocedure Evaluation (Addendum)
Anesthesia Evaluation  Patient identified by MRN, date of birth, ID band Patient awake    Reviewed: Allergy & Precautions, NPO status , Patient's Chart, lab work & pertinent test results  Airway Mallampati: II  TM Distance: >3 FB Neck ROM: Full   Comment: Previous Grade 3 with Mac 3 Dental  (+) Teeth Intact, Dental Advisory Given   Pulmonary neg pulmonary ROS,    Pulmonary exam normal breath sounds clear to auscultation (-) decreased breath sounds      Cardiovascular hypertension, Pt. on home beta blockers and Pt. on medications Normal cardiovascular exam     Neuro/Psych negative neurological ROS  negative psych ROS   GI/Hepatic Neg liver ROS, GERD  ,  Endo/Other  negative endocrine ROS  Renal/GU negative Renal ROS  negative genitourinary   Musculoskeletal  (+) Arthritis ,   Abdominal   Peds negative pediatric ROS (+)  Hematology negative hematology ROS (+)   Anesthesia Other Findings   Reproductive/Obstetrics negative OB ROS                            Anesthesia Physical  Anesthesia Plan  ASA: II  Anesthesia Plan: General   Post-op Pain Management:    Induction: Intravenous  Airway Management Planned: Oral ETT  Additional Equipment:   Intra-op Plan:   Post-operative Plan: Extubation in OR  Informed Consent: I have reviewed the patients History and Physical, chart, labs and discussed the procedure including the risks, benefits and alternatives for the proposed anesthesia with the patient or authorized representative who has indicated his/her understanding and acceptance.     Plan Discussed with: CRNA, Surgeon and Anesthesiologist  Anesthesia Plan Comments:         Anesthesia Quick Evaluation

## 2016-11-30 NOTE — Discharge Instructions (Signed)
° °Dr. Sennie Borden °Total Joint Specialist °[facility] °[address] °[place], [place] [address] °[phone] ° °TOTAL KNEE REPLACEMENT POSTOPERATIVE DIRECTIONS ° ° ° °Knee Rehabilitation, Guidelines Following Surgery  °Results after knee surgery are often greatly improved when you follow the exercise, range of motion and muscle strengthening exercises prescribed by your doctor. Safety measures are also important to protect the knee from further injury. Any time any of these exercises cause you to have increased pain or swelling in your knee joint, decrease the amount until you are comfortable again and slowly increase them. If you have problems or questions, call your caregiver or physical therapist for advice.  ° °WEIGHT BEARING °Weight bearing as tolerated with assist device (walker, cane, etc) as directed, use it as long as suggested by your surgeon or therapist, typically at least 4-6 weeks. ° °HOME CARE INSTRUCTIONS  °Remove items at home which could result in a fall. This includes throw rugs or furniture in walking pathways.  °Continue medications as instructed at time of discharge. °You may have some home medications which will be placed on hold until you complete the course of blood thinner medication.  °You may start showering once you are discharged home but do not submerge the incision under water. Just pat the incision dry and apply a dry gauze dressing on daily. °Walk with walker as instructed.  °You may resume a sexual relationship in one month or when given the OK by your doctor.  °· Use walker as long as suggested by your caregivers. °· Avoid periods of inactivity such as sitting longer than an hour when not asleep. This helps prevent blood clots.  °You may put full weight on your legs and walk as much as is comfortable.  °You may return to work once you are cleared by your doctor.  °Do not drive a car for 6 weeks or until released by you surgeon.  °· Do not drive  while taking narcotics.  °Wear the elastic stockings for three weeks following surgery during the day but you may remove then at night. °Make sure you keep all of your appointments after your operation with all of your doctors and caregivers. You should call the office at the above phone number and make an appointment for approximately two weeks after the date of your surgery. °Do not remove your surgical dressing. The dressing is waterproof; you may take showers in 3 days, but do not take tub baths or submerge the dressing. °Please pick up a stool softener and laxative for home use as long as you are requiring pain medications. °· ICE to the affected knee every three hours for 30 minutes at a time and then as needed for pain and swelling.  Continue to use ice on the knee for pain and swelling from surgery. You may notice swelling that will progress down to the foot and ankle.  This is normal after surgery.  Elevate the leg when you are not up walking on it.   °It is important for you to complete the blood thinner medication as prescribed by your doctor. °· Continue to use the breathing machine which will help keep your temperature down.  It is common for your temperature to cycle up and down following surgery, especially at night when you are not up moving around and exerting yourself.  The breathing machine keeps your lungs expanded and your temperature down. ° °RANGE OF MOTION AND STRENGTHENING EXERCISES  °Rehabilitation of the knee is important following   a knee injury or an operation. After just a few days of immobilization, the muscles of the thigh which control the knee become weakened and shrink (atrophy). Knee exercises are designed to build up the tone and strength of the thigh muscles and to improve knee motion. Often times heat used for twenty to thirty minutes before working out will loosen up your tissues and help with improving the range of motion but do not use heat for the first two weeks following  surgery. These exercises can be done on a training (exercise) mat, on the floor, on a table or on a bed. Use what ever works the best and is most comfortable for you Knee exercises include:  °Leg Lifts - While your knee is still immobilized in a splint or cast, you can do straight leg raises. Lift the leg to 60 degrees, hold for 3 sec, and slowly lower the leg. Repeat 10-20 times 2-3 times daily. Perform this exercise against resistance later as your knee gets better.  °Quad and Hamstring Sets - Tighten up the muscle on the front of the thigh (Quad) and hold for 5-10 sec. Repeat this 10-20 times hourly. Hamstring sets are done by pushing the foot backward against an object and holding for 5-10 sec. Repeat as with quad sets.  °A rehabilitation program following serious knee injuries can speed recovery and prevent re-injury in the future due to weakened muscles. Contact your doctor or a physical therapist for more information on knee rehabilitation.  ° °SKILLED REHAB INSTRUCTIONS: °If the patient is transferred to a skilled rehab facility following release from the hospital, a list of the current medications will be sent to the facility for the patient to continue.  When discharged from the skilled rehab facility, please have the facility set up the patient's Home Health Physical Therapy prior to being released. Also, the skilled facility will be responsible for providing the patient with their medications at time of release from the facility to include their pain medication, the muscle relaxants, and their blood thinner medication. If the patient is still at the rehab facility at time of the two week follow up appointment, the skilled rehab facility will also need to assist the patient in arranging follow up appointment in our office and any transportation needs. ° °MAKE SURE YOU:  °Understand these instructions.  °Will watch your condition.  °Will get help right away if you are not doing well or get worse.   ° ° °Pick up stool softner and laxative for home use following surgery while on pain medications. °Do NOT remove your dressing. You may shower.  °Do not take tub baths or submerge incision under water. °May shower starting three days after surgery. °Please use a clean towel to pat the incision dry following showers. °Continue to use ice for pain and swelling after surgery. °Do not use any lotions or creams on the incision until instructed by your surgeon. ° °

## 2016-11-30 NOTE — H&P (View-Only) (Signed)
TOTAL KNEE ADMISSION H&P  Patient is being admitted for left total knee arthroplasty.  Subjective:  Chief Complaint:left knee pain.  HPI: Heather Cox, 71 y.o. female, has a history of pain and functional disability in the left knee due to arthritis and has failed non-surgical conservative treatments for greater than 12 weeks to includeNSAID's and/or analgesics, corticosteriod injections, flexibility and strengthening excercises, supervised PT with diminished ADL's post treatment, use of assistive devices, weight reduction as appropriate and activity modification.  Onset of symptoms was gradual, starting 5 years ago with gradually worsening course since that time. The patient noted no past surgery on the left knee(s).  Patient currently rates pain in the left knee(s) at 10 out of 10 with activity. Patient has night pain, worsening of pain with activity and weight bearing, pain that interferes with activities of daily living, pain with passive range of motion, crepitus and joint swelling.  Patient has evidence of subchondral cysts, subchondral sclerosis, periarticular osteophytes and joint space narrowing by imaging studies.  There is no active infection.  Patient Active Problem List   Diagnosis Date Noted  . Primary osteoarthritis of right knee 06/08/2016   Past Medical History:  Diagnosis Date  . Arthritis   . GERD (gastroesophageal reflux disease)    occasionally and will take Tums if needed  . History of colon polyps    benign  . Hyperlipidemia    takes Atorvastatin daily  . Hypertension    takes Atenolol daily  . Joint pain     Past Surgical History:  Procedure Laterality Date  . ABDOMINAL HYSTERECTOMY  1994  . CARDIAC CATHETERIZATION  2017  . cataract surgery Bilateral   . CESAREAN SECTION  1977  . COLONOSCOPY    . KNEE ARTHROPLASTY Right 06/08/2016   Procedure: RIGHT TOTAL KNEE ARTHROPLASTY WITH COMPUTER NAVIGATION;  Surgeon: Samson Frederic, MD;  Location: [facility] OR;  Service:  Orthopedics;  Laterality: Right;  Request RNFA   . TONSILLECTOMY     at age 70  . WRIST SURGERY Left 1988  . YAG LASER APPLICATION Left 12/30/2015   Procedure: YAG LASER APPLICATION;  Surgeon: Susa Simmonds, MD;  Location: [facility];  Service: Ophthalmology;  Laterality: Left;  rescheduled to 2/20   . YAG LASER APPLICATION Right 09/04/2016   Procedure: YAG LASER APPLICATION;  Surgeon: Susa Simmonds, MD;  Location: [facility];  Service: Ophthalmology;  Laterality: Right;     (Not in a hospital admission) Allergies  Allergen Reactions  . Shrimp [Shellfish Allergy] Anaphylaxis, Itching and Swelling    Tongue swelling, lips numb    Social History  Substance Use Topics  . Smoking status: Never Smoker  . Smokeless tobacco: Not on file  . Alcohol use No    No family history on file.   Review of Systems  Constitutional: Negative.   HENT: Negative.   Eyes: Negative.   Respiratory: Negative.   Cardiovascular: Negative.   Gastrointestinal: Negative.   Genitourinary: Negative.   Musculoskeletal: Positive for joint pain.  Skin: Negative.   Neurological: Negative.   Endo/Heme/Allergies: Negative.   Psychiatric/Behavioral: Negative.     Objective:  Physical Exam  Vitals reviewed. Constitutional: She is oriented to person, place, and time. She appears well-developed and well-nourished.  HENT:  Head: Normocephalic and atraumatic.  Eyes: Conjunctivae and EOM are normal. Pupils are equal, round, and reactive to light.  Neck: Normal range of motion. Neck supple.  Cardiovascular: Normal rate and regular rhythm.   Respiratory: Effort normal. No respiratory  distress.  GI: Soft. She exhibits no distension.  Genitourinary:  Genitourinary Comments: deferred  Musculoskeletal:       Left knee: She exhibits decreased range of motion, swelling and effusion. Tenderness found. Medial joint line and lateral joint line tenderness noted.  Neurological: She is alert and oriented to person, place,  and time. She has normal reflexes.  Skin: Skin is warm.  Psychiatric: She has a normal mood and affect. Her behavior is normal. Judgment and thought content normal.    Vital signs in last 24 hours: @VSRANGES @  Labs:   Estimated body mass index is 37.62 kg/m as calculated from the following:   Height as of 05/28/16: 5\' 2"  (1.575 m).   Weight as of 05/28/16: 93.3 kg (205 lb 11.2 oz).   Imaging Review Plain radiographs demonstrate severe degenerative joint disease of the left knee(s). The overall alignment issignificant varus. The bone quality appears to be adequate for age and reported activity level.  Assessment/Plan:  End stage arthritis, left knee   The patient history, physical examination, clinical judgment of the provider and imaging studies are consistent with end stage degenerative joint disease of the left knee(s) and total knee arthroplasty is deemed medically necessary. The treatment options including medical management, injection therapy arthroscopy and arthroplasty were discussed at length. The risks and benefits of total knee arthroplasty were presented and reviewed. The risks due to aseptic loosening, infection, stiffness, patella tracking problems, thromboembolic complications and other imponderables were discussed. The patient acknowledged the explanation, agreed to proceed with the plan and consent was signed. Patient is being admitted for inpatient treatment for surgery, pain control, PT, OT, prophylactic antibiotics, VTE prophylaxis, progressive ambulation and ADL's and discharge planning. The patient is planning to be discharged home with home health services

## 2016-11-30 NOTE — Interval H&P Note (Signed)
History and Physical Interval Note:  11/30/2016 7:26 AM  Alease MedinaPatricia B Cox  has presented today for surgery, with the diagnosis of Left knee degenerative joint disease  The various methods of treatment have been discussed with the patient and family. After consideration of risks, benefits and other options for treatment, the patient has consented to  Procedure(s): TOTAL KNEE ARTHROPLASTY with Navigation (Left) as a surgical intervention .  The patient's history has been reviewed, patient examined, no change in status, stable for surgery.  I have reviewed the patient's chart and labs.  Questions were answered to the patient's satisfaction.     Aiyanna Awtrey, Cloyde ReamsBrian James

## 2016-12-01 ENCOUNTER — Encounter (HOSPITAL_COMMUNITY): Payer: Self-pay | Admitting: Orthopedic Surgery

## 2016-12-01 LAB — CBC
HCT: 27.2 % — ABNORMAL LOW (ref 36.0–46.0)
Hemoglobin: 8.7 g/dL — ABNORMAL LOW (ref 12.0–15.0)
MCH: 29.2 pg (ref 26.0–34.0)
MCHC: 32 g/dL (ref 30.0–36.0)
MCV: 91.3 fL (ref 78.0–100.0)
PLATELETS: 143 10*3/uL — AB (ref 150–400)
RBC: 2.98 MIL/uL — AB (ref 3.87–5.11)
RDW: 14.4 % (ref 11.5–15.5)
WBC: 7.8 10*3/uL (ref 4.0–10.5)

## 2016-12-01 LAB — BASIC METABOLIC PANEL
Anion gap: 6 (ref 5–15)
BUN: 14 mg/dL (ref 6–20)
CO2: 26 mmol/L (ref 22–32)
CREATININE: 0.85 mg/dL (ref 0.44–1.00)
Calcium: 8.6 mg/dL — ABNORMAL LOW (ref 8.9–10.3)
Chloride: 109 mmol/L (ref 101–111)
Glucose, Bld: 94 mg/dL (ref 65–99)
Potassium: 4 mmol/L (ref 3.5–5.1)
SODIUM: 141 mmol/L (ref 135–145)

## 2016-12-01 NOTE — Progress Notes (Signed)
   Subjective:  Patient reports pain as mild to moderate.  No c/o.  Objective:   VITALS:   Vitals:   11/30/16 1649 11/30/16 2030 12/01/16 0057 12/01/16 0442  BP: 102/61 (!) 101/56 110/68 112/73  Pulse: 76 90 82 79  Resp: 14 14 14 14   Temp:  97.5 F (36.4 C) 97.5 F (36.4 C) 97.1 F (36.2 C)  TempSrc:  Oral Oral Oral  SpO2: 100% 96% 96% 96%  Weight:        NAD ABD soft Sensation intact distally Intact pulses distally Dorsiflexion/Plantar flexion intact Incision: dressing C/D/I Compartment soft   Lab Results  Component Value Date   WBC 7.8 12/01/2016   HGB 8.7 (L) 12/01/2016   HCT 27.2 (L) 12/01/2016   MCV 91.3 12/01/2016   PLT 143 (L) 12/01/2016   BMET    Component Value Date/Time   NA 141 12/01/2016 0416   K 4.0 12/01/2016 0416   CL 109 12/01/2016 0416   CO2 26 12/01/2016 0416   GLUCOSE 94 12/01/2016 0416   BUN 14 12/01/2016 0416   CREATININE 0.85 12/01/2016 0416   CALCIUM 8.6 (L) 12/01/2016 0416   GFRNONAA >60 12/01/2016 0416   GFRAA >60 12/01/2016 0416     Assessment/Plan: 1 Day Post-Op   Principal Problem:   Osteoarthritis of left knee Active Problems:   Degenerative arthritis of left knee   WBAT with walker PT/OT DVT ppx: ASA, SCDs, TEDs PO pain control ABLA: asymptomatic, monitor Dispo: check hgb in am, anticipate d/c home tomorrow. Outpatient PT already set up   Karlton Maya, Cloyde ReamsBrian James 12/01/2016, 7:45 AM   Samson FredericBrian Glendene Wyer, MD Cell [phone]

## 2016-12-01 NOTE — Care Management Note (Signed)
Case Management Note  Patient Details  Name: Heather Cox MRN: [id number] Date of Birth: 10/17/1946  Subjective/Objective:     71 yr old female s/p left total knee arthroplasty.               Action/Plan: Patient has no Home Healkth needs. She will go directly to outpatient therapy at discharge. Patient has rolling walker and 3in1 from previous surgery. Will have family support at discharge.    Expected Discharge Date:   12/02/16               Expected Discharge Plan:    Home/Self Care In-House Referral:  NA  Discharge planning Services     Post Acute Care Choice:  NA Choice offered to:  NA  DME Arranged:  N/A DME Agency:     HH Arranged:  NA HH Agency:  NA  Status of Service:  Completed, signed off  If discussed at Long Length of Stay Meetings, dates discussed:    Additional Comments:  Heather Cox, Heather Sheriff Naomi, RN 12/01/2016, 11:30 AM

## 2016-12-01 NOTE — Evaluation (Signed)
Physical Therapy Evaluation Patient Details Name: Heather Cox MRN: [id number] DOB: 12/24/1945 Today's Date: 12/01/2016   History of Present Illness  Heather Cox, 71 y.o. female, has a history of pain and functional disability in the left knee due to arthritis and has failed non-surgical conservative treatments for greater than 12 weeks .  Pt underwent left TKA 11/30/16.    Clinical Impression  Pt admitted with above diagnosis. Pt currently with functional limitations due to the deficits listed below (see PT Problem List). Pt was able to ambulate with RW with good safety.  Has support at home and equipment.  Will follow acutely.  Pt will benefit from skilled PT to increase their independence and safety with mobility to allow discharge to the venue listed below.    Follow Up Recommendations Outpatient PT;Supervision/Assistance - 24 hour    Equipment Recommendations  None recommended by PT    Recommendations for Other Services       Precautions / Restrictions Precautions Precautions: Fall;Knee Precaution Booklet Issued: Yes (comment) Restrictions Weight Bearing Restrictions: Yes LLE Weight Bearing: Weight bearing as tolerated      Mobility  Bed Mobility Overal bed mobility: Independent                Transfers Overall transfer level: Needs assistance Equipment used: Rolling walker (2 wheeled) Transfers: Sit to/from Stand Sit to Stand: Min guard         General transfer comment: cues for hand placement and steadying assist once up.   Ambulation/Gait Ambulation/Gait assistance: Min guard Ambulation Distance (Feet): 145 Feet Assistive device: Rolling walker (2 wheeled) Gait Pattern/deviations: Step-to pattern;Decreased stance time - left;Decreased weight shift to left;Decreased dorsiflexion - left;Decreased stride length;Antalgic;Wide base of support;Trunk flexed   Gait velocity interpretation: Below normal speed for age/gender General Gait Details: Pt safe  using RW.  HAd to cue for sequencing steps and RW.   Stairs            Wheelchair Mobility    Modified Rankin (Stroke Patients Only)       Balance Overall balance assessment: Needs assistance Sitting-balance support: No upper extremity supported;Feet supported Sitting balance-Leahy Scale: Fair     Standing balance support: Bilateral upper extremity supported;During functional activity Standing balance-Leahy Scale: Poor Standing balance comment: relies on RW for balance                             Pertinent Vitals/Pain Pain Assessment: 0-10 Pain Score: 5  Pain Location: left knee Pain Descriptors / Indicators: Aching;Grimacing;Guarding;Sore Pain Intervention(s): Monitored during session;Limited activity within patient's tolerance;Premedicated before session;Repositioned;Ice applied  VSS on RA sats >94%.  Home Living Family/patient expects to be discharged to:: Private residence Living Arrangements: Alone Available Help at Discharge: Family;Available 24 hours/day (daughter for a week) Type of Home: House Home Access: Level entry     Home Layout: One level Home Equipment: Walker - 2 wheels;Bedside commode;Shower seat;Toilet riser      Prior Function Level of Independence: Independent         Comments: Did Cleaning, trash etc and drives.  Takes care of her cat.      Hand Dominance   Dominant Hand: Right    Extremity/Trunk Assessment   Upper Extremity Assessment Upper Extremity Assessment: Defer to OT evaluation    Lower Extremity Assessment Lower Extremity Assessment: LLE deficits/detail LLE Deficits / Details: hip 3-/5, knee 3-/5, ankle 3/5    Cervical / Trunk Assessment Cervical /  Trunk Assessment: Kyphotic  Communication   Communication: No difficulties  Cognition Arousal/Alertness: Awake/alert Behavior During Therapy: WFL for tasks assessed/performed Overall Cognitive Status: Within Functional Limits for tasks assessed                       General Comments      Exercises Total Joint Exercises Ankle Circles/Pumps: AROM;Both;5 reps;Supine Quad Sets: AROM;Both;5 reps;Supine Towel Squeeze: AROM;Both;5 reps;Supine Heel Slides: Left;5 reps;Supine;AAROM Hip ABduction/ADduction: AROM;Left;5 reps;Supine Straight Leg Raises: AAROM;Left;5 reps;Supine Knee Flexion: AROM;Left;5 reps;Seated Goniometric ROM: 8-82 degrees   Assessment/Plan    PT Assessment Patient needs continued PT services  PT Problem List Decreased activity tolerance;Decreased balance;Decreased strength;Decreased range of motion;Decreased mobility;Decreased knowledge of use of DME;Decreased safety awareness;Decreased knowledge of precautions;Pain          PT Treatment Interventions DME instruction;Gait training;Functional mobility training;Therapeutic activities;Therapeutic exercise;Balance training;Patient/family education    PT Goals (Current goals can be found in the Care Plan section)  Acute Rehab PT Goals Patient Stated Goal: to get better PT Goal Formulation: With patient Time For Goal Achievement: 12/08/16 Potential to Achieve Goals: Good    Frequency 7X/week   Barriers to discharge        Co-evaluation               End of Session Equipment Utilized During Treatment: Gait belt Activity Tolerance: Patient limited by fatigue;Patient limited by pain Patient left: in chair;with call bell/phone within reach Nurse Communication: Mobility status         Time: 1610-9604 PT Time Calculation (min) (ACUTE ONLY): 23 min   Charges:   PT Evaluation $PT Eval Moderate Complexity: 1 Procedure PT Treatments $Gait Training: 8-22 mins   PT G Codes:        Amadeo Garnet Aixa Corsello 12/30/16, 11:15 AM Danaiya Steadman,PT Acute Rehabilitation [phone] (pager)

## 2016-12-01 NOTE — Progress Notes (Signed)
Physical Therapy Treatment Patient Details Name: Heather Cox MRN: [id number] DOB: 07/03/1946 Today's Date: 12/01/2016    History of Present Illness Heather Cox, 71 y.o. female, has a history of pain and functional disability in the left knee due to arthritis and has failed non-surgical conservative treatments for greater than 12 weeks .  Pt underwent left TKA 11/30/16.      PT Comments    Pt progressing well, pt reviewed HEP and advance gait training.  Plan for gait and exercise in am before d/c.    Follow Up Recommendations  Outpatient PT;Supervision/Assistance - 24 hour     Equipment Recommendations  None recommended by PT    Recommendations for Other Services       Precautions / Restrictions Precautions Precautions: Fall;Knee Precaution Booklet Issued: Yes (comment) Restrictions Weight Bearing Restrictions: Yes LLE Weight Bearing: Weight bearing as tolerated    Mobility  Bed Mobility Overal bed mobility: Needs Assistance Bed Mobility: Supine to Sit;Sit to Supine     Supine to sit: Supervision Sit to supine: Min assist   General bed mobility comments: Required assist to lift LLE into bed.    Transfers Overall transfer level: Needs assistance Equipment used: Rolling walker (2 wheeled) Transfers: Sit to/from Stand Sit to Stand: Min guard         General transfer comment: Pt performed with cues for hand placement.    Ambulation/Gait Ambulation/Gait assistance: Min guard Ambulation Distance (Feet): 200 Feet Assistive device: Rolling walker (2 wheeled) Gait Pattern/deviations: Step-through pattern;Trunk flexed;Wide base of support;Decreased stride length   Gait velocity interpretation: Below normal speed for age/gender General Gait Details: Cues for heel strike and toe off to improve knee flexion on LLE.    Stairs            Wheelchair Mobility    Modified Rankin (Stroke Patients Only)       Balance Overall balance assessment: Needs  assistance Sitting-balance support: No upper extremity supported Sitting balance-Leahy Scale: Fair       Standing balance-Leahy Scale: Poor                      Cognition Arousal/Alertness: Awake/alert Behavior During Therapy: WFL for tasks assessed/performed Overall Cognitive Status: Within Functional Limits for tasks assessed                      Exercises Total Joint Exercises Ankle Circles/Pumps: AROM;Both;10 reps;Supine Quad Sets: AROM;Left;10 reps;Supine Heel Slides: AROM;Left;10 reps;Supine Hip ABduction/ADduction: AROM;Left;10 reps;Supine Straight Leg Raises: AROM;Left;10 reps;Supine    General Comments        Pertinent Vitals/Pain Pain Assessment: 0-10 Pain Location: left knee Pain Descriptors / Indicators: Aching;Grimacing;Guarding;Sore Pain Intervention(s): Monitored during session;Repositioned    Home Living                      Prior Function            PT Goals (current goals can now be found in the care plan section) Acute Rehab PT Goals Patient Stated Goal: to get better Potential to Achieve Goals: Good Progress towards PT goals: Progressing toward goals    Frequency    7X/week      PT Plan Current plan remains appropriate    Co-evaluation             End of Session Equipment Utilized During Treatment: Gait belt Activity Tolerance: Patient limited by fatigue;Patient limited by pain Patient left: with call bell/phone  within reach;in bed;with bed alarm set     Time: 1415-1434 PT Time Calculation (min) (ACUTE ONLY): 19 min  Charges:  $Gait Training: 8-22 mins                    G Codes:      Florestine Avers 12-13-2016, 5:29 PM Heather Cox, PTA pager [phone]

## 2016-12-01 NOTE — Discharge Summary (Signed)
Physician Discharge Summary  Patient ID: Heather Cox Heiney MRN: [id number] DOB/AGE: 73/11/1945 73 y.o.  Admit date: 11/30/2016 Discharge date: 12/02/2016  Admission Diagnoses:  Osteoarthritis of left knee  Discharge Diagnoses:  Principal Problem:   Osteoarthritis of left knee Active Problems:   Degenerative arthritis of left knee   Past Medical History:  Diagnosis Date  . Arthritis   . GERD (gastroesophageal reflux disease)    occasionally and will take Tums if needed  . History of colon polyps    benign  . Hyperlipidemia    takes Atorvastatin daily  . Hypertension    takes Atenolol daily  . Joint pain     Surgeries: Procedure(s): TOTAL KNEE ARTHROPLASTY with Navigation on 11/30/2016   Consultants (if any):   Discharged Condition: Improved  Hospital Course: Heather Cox Royals is an 71 y.o. female who was admitted 11/30/2016 with a diagnosis of Osteoarthritis of left knee and went to the operating room on 11/30/2016 and underwent the above named procedures.    She was given perioperative antibiotics:  Anti-infectives    Start     Dose/Rate Route Frequency Ordered Stop   11/30/16 1700  ceFAZolin (ANCEF) IVPB 2g/100 mL premix     2 g 200 mL/hr over 30 Minutes Intravenous Every 6 hours 11/30/16 1640 12/01/16 0021   11/30/16 0539  ceFAZolin (ANCEF) IVPB 2g/100 mL premix     2 g 200 mL/hr over 30 Minutes Intravenous On call to O.R. 11/30/16 [id number] 11/30/16 0748    .  She was given sequential compression devices, early ambulation, and ASA for DVT prophylaxis.  She benefited maximally from the hospital stay and there were no complications.    Recent vital signs:  Vitals:   12/01/16 2202 12/02/16 0642  BP: 120/67 124/76  Pulse: 88 80  Resp: 16 16  Temp: 98.7 F (37.1 C) 98.1 F (36.7 C)    Recent laboratory studies:  Lab Results  Component Value Date   HGB 8.5 (L) 12/02/2016   HGB 8.7 (L) 12/01/2016   HGB 13.5 11/19/2016   Lab Results  Component Value Date    WBC 12.4 (H) 12/02/2016   PLT 164 12/02/2016   No results found for: INR Lab Results  Component Value Date   NA 141 12/01/2016   K 4.0 12/01/2016   CL 109 12/01/2016   CO2 26 12/01/2016   BUN 14 12/01/2016   CREATININE 0.85 12/01/2016   GLUCOSE 94 12/01/2016    Discharge Medications:   Allergies as of 12/02/2016      Reactions   Shrimp [shellfish Allergy] Anaphylaxis, Itching, Swelling   Tongue swelling, lips numb      Medication List    TAKE these medications   ANACIN 400-32 MG Tabs Generic drug:  Aspirin-Caffeine Take 2 tablets by mouth 2 (two) times daily as needed (for pain or headache).   aspirin 81 MG chewable tablet Chew 1 tablet (81 mg total) by mouth 2 (two) times daily.   atenolol 25 MG tablet Commonly known as:  TENORMIN Take 25 mg by mouth daily.   atorvastatin 10 MG tablet Commonly known as:  LIPITOR Take 10 mg by mouth daily.   calcium carbonate 500 MG chewable tablet Commonly known as:  TUMS - dosed in mg elemental calcium Chew 1 tablet by mouth daily as needed for indigestion or heartburn.   docusate sodium 100 MG capsule Commonly known as:  COLACE Take 1 capsule (100 mg total) by mouth 2 (two) times daily.   HYDROcodone-acetaminophen  5-325 MG tablet Commonly known as:  NORCO/VICODIN Take 1-2 tablets by mouth every 4 (four) hours as needed (breakthrough pain).   methocarbamol 500 MG tablet Commonly known as:  ROBAXIN Take 1 tablet (500 mg total) by mouth every 6 (six) hours as needed for muscle spasms.   ondansetron 4 MG tablet Commonly known as:  ZOFRAN Take 1 tablet (4 mg total) by mouth every 6 (six) hours as needed for nausea.   senna 8.6 MG Tabs tablet Commonly known as:  SENOKOT Take 2 tablets (17.2 mg total) by mouth at bedtime.       Diagnostic Studies: Dg Knee Left Port  Result Date: 11/30/2016 CLINICAL DATA:  Left knee replacement EXAM: PORTABLE LEFT KNEE - 1-2 VIEW COMPARISON:  None. FINDINGS: The left knee demonstrates a  total knee arthroplasty without evidence of hardware failure complication. There is no significant joint effusion. There is no fracture or dislocation. The alignment is anatomic. Post-surgical changes noted in the surrounding soft tissues. IMPRESSION: Interval left total knee arthroplasty. Electronically Signed   By: Elige Ko   On: 11/30/2016 11:09    Disposition: 01-Home or Self Care  Discharge Instructions    Call MD / Call 911    Complete by:  As directed    If you experience chest pain or shortness of breath, CALL 911 and be transported to the hospital emergency room.  If you develope a fever above 101 F, pus (white drainage) or increased drainage or redness at the wound, or calf pain, call your surgeon's office.   Constipation Prevention    Complete by:  As directed    Drink plenty of fluids.  Prune juice may be helpful.  You may use a stool softener, such as Colace (over the counter) 100 mg twice a day.  Use MiraLax (over the counter) for constipation as needed.   Diet - low sodium heart healthy    Complete by:  As directed    Do not put a pillow under the knee. Place it under the heel.    Complete by:  As directed    Driving restrictions    Complete by:  As directed    No driving for 6 weeks   Increase activity slowly as tolerated    Complete by:  As directed    Lifting restrictions    Complete by:  As directed    No lifting for 6 weeks   TED hose    Complete by:  As directed    Use stockings (TED hose) for 2 weeks on both leg(s).  You may remove them at night for sleeping.      Follow-up Information    Cree Kunert, Cloyde Reams, MD. Schedule an appointment as soon as possible for a visit in 2 weeks.   Specialty:  Orthopedic Surgery Why:  For wound re-check Contact information: [address] [place] [address] [phone]            Signed: Garnet Koyanagi 12/02/2016, 7:29 PM

## 2016-12-01 NOTE — Progress Notes (Signed)
OT Cancellation Note and Discharge  Patient Details Name: Heather Cox MRN: [id number] DOB: 01/27/1946   Cancelled Treatment:    Reason Eval/Treat Not Completed: OT screened, no needs identified, will sign off. Pt had other knee done about 6 months ago and feels she is well aware of how to do her basic ADLs and has all needed DME as well as her dtr staying with her for a week.   Evette GeorgesLeonard, Heather Cox Eva [phone] 12/01/2016, 11:44 AM

## 2016-12-02 LAB — CBC
HCT: 26.6 % — ABNORMAL LOW (ref 36.0–46.0)
HEMOGLOBIN: 8.5 g/dL — AB (ref 12.0–15.0)
MCH: 29 pg (ref 26.0–34.0)
MCHC: 32 g/dL (ref 30.0–36.0)
MCV: 90.8 fL (ref 78.0–100.0)
Platelets: 164 10*3/uL (ref 150–400)
RBC: 2.93 MIL/uL — ABNORMAL LOW (ref 3.87–5.11)
RDW: 14.3 % (ref 11.5–15.5)
WBC: 12.4 10*3/uL — AB (ref 4.0–10.5)

## 2016-12-02 MED ORDER — METHOCARBAMOL 500 MG PO TABS: 500.0000 mg | ORAL_TABLET | Freq: Four times a day (QID) | ORAL | 0 refills | Status: DC | PRN

## 2016-12-02 MED ORDER — ASPIRIN 81 MG PO CHEW: 81.0000 mg | CHEWABLE_TABLET | Freq: Two times a day (BID) | ORAL | 1 refills | Status: DC

## 2016-12-02 MED ORDER — ONDANSETRON HCL 4 MG PO TABS: 4.0000 mg | ORAL_TABLET | Freq: Four times a day (QID) | ORAL | 0 refills | Status: DC | PRN

## 2016-12-02 MED ORDER — SENNA 8.6 MG PO TABS: 2.0000 | ORAL_TABLET | Freq: Every day | ORAL | 0 refills | Status: DC

## 2016-12-02 MED ORDER — DOCUSATE SODIUM 100 MG PO CAPS: 100.0000 mg | ORAL_CAPSULE | Freq: Two times a day (BID) | ORAL | 0 refills | Status: DC

## 2016-12-02 MED ORDER — HYDROCODONE-ACETAMINOPHEN 5-325 MG PO TABS: 1.0000 | ORAL_TABLET | ORAL | 0 refills | Status: DC | PRN

## 2016-12-02 NOTE — Progress Notes (Signed)
Physical Therapy Treatment Patient Details Name: Heather Medinaatricia B Furia MRN: [id number] DOB: 06/03/1946 Today's Date: 12/02/2016    History of Present Illness Heather MedinaPatricia B Domine, 71 y.o. female, has a history of pain and functional disability in the left knee due to arthritis and has failed non-surgical conservative treatments for greater than 12 weeks .  Pt underwent left TKA 11/30/16.      PT Comments    Patient is making good progress with PT.  From a mobility standpoint anticipate patient will be ready for DC home when medically ready.     Follow Up Recommendations  Outpatient PT;Supervision/Assistance - 24 hour     Equipment Recommendations  None recommended by PT    Recommendations for Other Services       Precautions / Restrictions Precautions Precautions: Fall;Knee Precaution Booklet Issued: Yes (comment) Restrictions Weight Bearing Restrictions: Yes LLE Weight Bearing: Weight bearing as tolerated    Mobility  Bed Mobility Overal bed mobility: Independent Bed Mobility: Supine to Sit              Transfers Overall transfer level: Needs assistance Equipment used: Rolling walker (2 wheeled) Transfers: Sit to/from Stand Sit to Stand: Supervision         General transfer comment: carry over of safe hand placement and technique; supervision for safety  Ambulation/Gait Ambulation/Gait assistance: Supervision Ambulation Distance (Feet): 250 Feet Assistive device: Rolling walker (2 wheeled) Gait Pattern/deviations: Step-through pattern;Trunk flexed;Wide base of support;Decreased stride length     General Gait Details: pt with improved gait mechanics and WB this session; cues for posture initially   Stairs            Wheelchair Mobility    Modified Rankin (Stroke Patients Only)       Balance     Sitting balance-Leahy Scale: Good       Standing balance-Leahy Scale: Fair Standing balance comment: static stand briefly with no UE support                     Cognition Arousal/Alertness: Awake/alert Behavior During Therapy: WFL for tasks assessed/performed Overall Cognitive Status: Within Functional Limits for tasks assessed                      Exercises Total Joint Exercises Quad Sets: AROM;Left;10 reps Short Arc Quad: AROM;Left;10 reps Heel Slides: AROM;AAROM;Left;10 reps Hip ABduction/ADduction: AROM;Left;10 reps Straight Leg Raises: AROM;Left;10 reps Long Arc Quad: AROM;Left;10 reps Knee Flexion: AROM;Left;5 reps;Seated;Other (comment) (10 sec holds) Goniometric ROM: 5-90    General Comments        Pertinent Vitals/Pain Pain Assessment: Faces Faces Pain Scale: Hurts little more Pain Location: left knee Pain Descriptors / Indicators: Sore;Tightness;Other (Comment) ("pulling") Pain Intervention(s): Limited activity within patient's tolerance;Monitored during session;Repositioned;Patient requesting pain meds-RN notified    Home Living                      Prior Function            PT Goals (current goals can now be found in the care plan section) Acute Rehab PT Goals Patient Stated Goal: go home Progress towards PT goals: Progressing toward goals    Frequency    7X/week      PT Plan Current plan remains appropriate    Co-evaluation             End of Session Equipment Utilized During Treatment: Gait belt Activity Tolerance: Patient tolerated treatment well Patient left:  in chair;with call bell/phone within reach;with family/visitor present     Time: 1026-1059 PT Time Calculation (min) (ACUTE ONLY): 33 min  Charges:  $Gait Training: 8-22 mins $Therapeutic Exercise: 8-22 mins                    G Codes:      Derek Mound, PTA Pager: [phone]   12/02/2016, 11:06 AM

## 2016-12-02 NOTE — Progress Notes (Signed)
   Subjective:  Patient reports pain as mild to moderate.  No c/o.  Objective:   VITALS:   Vitals:   12/01/16 0442 12/01/16 1420 12/01/16 2202 12/02/16 0642  BP: 112/73 130/74 120/67 124/76  Pulse: 79 91 88 80  Resp: 14 16 16 16   Temp: 97.1 F (36.2 C) 97.1 F (36.2 C) 98.7 F (37.1 C) 98.1 F (36.7 C)  TempSrc: Oral Oral Oral Oral  SpO2: 96% 96% 94% 98%  Weight:        NAD ABD soft Sensation intact distally Intact pulses distally Dorsiflexion/Plantar flexion intact Incision: dressing C/D/I Compartment soft   Lab Results  Component Value Date   WBC 12.4 (H) 12/02/2016   HGB 8.5 (L) 12/02/2016   HCT 26.6 (L) 12/02/2016   MCV 90.8 12/02/2016   PLT 164 12/02/2016   BMET    Component Value Date/Time   NA 141 12/01/2016 0416   K 4.0 12/01/2016 0416   CL 109 12/01/2016 0416   CO2 26 12/01/2016 0416   GLUCOSE 94 12/01/2016 0416   BUN 14 12/01/2016 0416   CREATININE 0.85 12/01/2016 0416   CALCIUM 8.6 (L) 12/01/2016 0416   GFRNONAA >60 12/01/2016 0416   GFRAA >60 12/01/2016 0416     Assessment/Plan: 2 Days Post-Op   Principal Problem:   Osteoarthritis of left knee Active Problems:   Degenerative arthritis of left knee   WBAT with walker PT/OT DVT ppx: ASA, SCDs, TEDs PO pain control ABLA: asymptomatic, monitor Dispo:  d/c home. Outpatient PT already set up   Gaytha Raybourn, Cloyde ReamsBrian James 12/02/2016, 7:33 AM   Samson FredericBrian Zachary Lovins, MD Cell [phone]

## 2016-12-04 DIAGNOSIS — M25562 Pain in left knee: Secondary | ICD-10-CM | POA: Diagnosis not present

## 2016-12-07 DIAGNOSIS — M25562 Pain in left knee: Secondary | ICD-10-CM | POA: Diagnosis not present

## 2016-12-09 DIAGNOSIS — M25562 Pain in left knee: Secondary | ICD-10-CM | POA: Diagnosis not present

## 2016-12-11 DIAGNOSIS — M25562 Pain in left knee: Secondary | ICD-10-CM | POA: Diagnosis not present

## 2016-12-14 DIAGNOSIS — M25562 Pain in left knee: Secondary | ICD-10-CM | POA: Diagnosis not present

## 2016-12-16 DIAGNOSIS — M25562 Pain in left knee: Secondary | ICD-10-CM | POA: Diagnosis not present

## 2016-12-18 DIAGNOSIS — M25562 Pain in left knee: Secondary | ICD-10-CM | POA: Diagnosis not present

## 2016-12-18 DIAGNOSIS — Z471 Aftercare following joint replacement surgery: Secondary | ICD-10-CM | POA: Diagnosis not present

## 2016-12-18 DIAGNOSIS — Z96652 Presence of left artificial knee joint: Secondary | ICD-10-CM | POA: Diagnosis not present

## 2016-12-21 DIAGNOSIS — M25562 Pain in left knee: Secondary | ICD-10-CM | POA: Diagnosis not present

## 2016-12-23 DIAGNOSIS — M25562 Pain in left knee: Secondary | ICD-10-CM | POA: Diagnosis not present

## 2016-12-25 DIAGNOSIS — M25562 Pain in left knee: Secondary | ICD-10-CM | POA: Diagnosis not present

## 2016-12-28 DIAGNOSIS — M25562 Pain in left knee: Secondary | ICD-10-CM | POA: Diagnosis not present

## 2016-12-31 DIAGNOSIS — M25562 Pain in left knee: Secondary | ICD-10-CM | POA: Diagnosis not present

## 2017-01-04 DIAGNOSIS — M25562 Pain in left knee: Secondary | ICD-10-CM | POA: Diagnosis not present

## 2017-01-07 DIAGNOSIS — M25562 Pain in left knee: Secondary | ICD-10-CM | POA: Diagnosis not present

## 2017-01-11 DIAGNOSIS — M25562 Pain in left knee: Secondary | ICD-10-CM | POA: Diagnosis not present

## 2017-01-14 DIAGNOSIS — M25562 Pain in left knee: Secondary | ICD-10-CM | POA: Diagnosis not present

## 2017-01-20 DIAGNOSIS — Z96652 Presence of left artificial knee joint: Secondary | ICD-10-CM | POA: Diagnosis not present

## 2017-01-20 DIAGNOSIS — Z471 Aftercare following joint replacement surgery: Secondary | ICD-10-CM | POA: Diagnosis not present

## 2017-01-21 DIAGNOSIS — M25562 Pain in left knee: Secondary | ICD-10-CM | POA: Diagnosis not present

## 2017-02-11 ENCOUNTER — Other Ambulatory Visit (INDEPENDENT_AMBULATORY_CARE_PROVIDER_SITE_OTHER): Payer: Self-pay | Admitting: *Deleted

## 2017-02-11 DIAGNOSIS — Z86010 Personal history of colonic polyps: Secondary | ICD-10-CM

## 2017-05-11 ENCOUNTER — Telehealth (INDEPENDENT_AMBULATORY_CARE_PROVIDER_SITE_OTHER): Payer: Self-pay | Admitting: *Deleted

## 2017-05-11 ENCOUNTER — Encounter (INDEPENDENT_AMBULATORY_CARE_PROVIDER_SITE_OTHER): Payer: Self-pay | Admitting: *Deleted

## 2017-05-11 DIAGNOSIS — Z86010 Personal history of colonic polyps: Secondary | ICD-10-CM

## 2017-05-11 MED ORDER — PEG 3350-KCL-NA BICARB-NACL 420 G PO SOLR: 4000.0000 mL | Freq: Once | ORAL | 0 refills | Status: AC

## 2017-05-11 NOTE — Telephone Encounter (Signed)
Referring MD/PCP: vyas   Procedure: tcs  Reason/Indication:  Hx polyps  Has patient had this procedure before?  Yes, 2010  If so, when, by whom and where?    Is there a family history of colon cancer?  no  Who?  What age when diagnosed?    Is patient diabetic?   no      Does patient have prosthetic heart valve or mechanical valve?  no  Do you have a pacemaker?  no  Has patient ever had endocarditis? no  Has patient had joint replacement within last 12 months?  Yes knee 11/2016  Does patient tend to be constipated or take laxatives? no  Does patient have a history of alcohol/drug use?  no  Is patient on Coumadin, Plavix and/or Aspirin? no  Medications: atenolol 25 mg daily, atorvastatin 10 mg daily  Allergies: nkda  Medication Adjustment per Dr Karilyn Cotaehman:   Procedure date & time: 06/10/17 at 830

## 2017-05-11 NOTE — Telephone Encounter (Signed)
Patient needs osmo pill -- hx polyps

## 2017-05-13 NOTE — Telephone Encounter (Signed)
agree

## 2017-05-14 ENCOUNTER — Telehealth (INDEPENDENT_AMBULATORY_CARE_PROVIDER_SITE_OTHER): Payer: Self-pay | Admitting: *Deleted

## 2017-05-14 NOTE — Telephone Encounter (Signed)
Patient needs osmo pill prep 

## 2017-05-17 MED ORDER — SOD PHOS MONO-SOD PHOS DIBASIC 1.102-0.398 G PO TABS: 1.0000 | ORAL_TABLET | Freq: Once | ORAL | 0 refills | Status: AC

## 2017-06-01 DIAGNOSIS — Z96652 Presence of left artificial knee joint: Secondary | ICD-10-CM | POA: Diagnosis not present

## 2017-06-01 DIAGNOSIS — M1712 Unilateral primary osteoarthritis, left knee: Secondary | ICD-10-CM | POA: Diagnosis not present

## 2017-06-01 DIAGNOSIS — Z96651 Presence of right artificial knee joint: Secondary | ICD-10-CM | POA: Diagnosis not present

## 2017-06-01 DIAGNOSIS — Z471 Aftercare following joint replacement surgery: Secondary | ICD-10-CM | POA: Diagnosis not present

## 2017-06-10 ENCOUNTER — Encounter (HOSPITAL_COMMUNITY): Payer: Self-pay | Admitting: *Deleted

## 2017-06-10 ENCOUNTER — Ambulatory Visit (HOSPITAL_COMMUNITY)
Admission: RE | Admit: 2017-06-10 | Discharge: 2017-06-10 | Disposition: A | Discharge status: Home / Self Care | Payer: Medicare Other | Source: Ambulatory Visit | Attending: Internal Medicine | Admitting: Internal Medicine

## 2017-06-10 ENCOUNTER — Encounter (HOSPITAL_COMMUNITY)
Admission: RE | Disposition: A | Discharge status: Home / Self Care | Payer: Self-pay | Source: Ambulatory Visit | Attending: Internal Medicine

## 2017-06-10 DIAGNOSIS — K644 Residual hemorrhoidal skin tags: Secondary | ICD-10-CM | POA: Diagnosis not present

## 2017-06-10 DIAGNOSIS — Z860100 Personal history of colon polyps, unspecified: Secondary | ICD-10-CM | POA: Insufficient documentation

## 2017-06-10 DIAGNOSIS — K219 Gastro-esophageal reflux disease without esophagitis: Secondary | ICD-10-CM | POA: Diagnosis not present

## 2017-06-10 DIAGNOSIS — E785 Hyperlipidemia, unspecified: Secondary | ICD-10-CM | POA: Insufficient documentation

## 2017-06-10 DIAGNOSIS — K5730 Diverticulosis of large intestine without perforation or abscess without bleeding: Secondary | ICD-10-CM | POA: Insufficient documentation

## 2017-06-10 DIAGNOSIS — Z96651 Presence of right artificial knee joint: Secondary | ICD-10-CM | POA: Insufficient documentation

## 2017-06-10 DIAGNOSIS — Z1211 Encounter for screening for malignant neoplasm of colon: Secondary | ICD-10-CM | POA: Diagnosis not present

## 2017-06-10 DIAGNOSIS — K648 Other hemorrhoids: Secondary | ICD-10-CM | POA: Diagnosis not present

## 2017-06-10 DIAGNOSIS — I10 Essential (primary) hypertension: Secondary | ICD-10-CM | POA: Diagnosis not present

## 2017-06-10 DIAGNOSIS — Z79899 Other long term (current) drug therapy: Secondary | ICD-10-CM | POA: Diagnosis not present

## 2017-06-10 DIAGNOSIS — Z09 Encounter for follow-up examination after completed treatment for conditions other than malignant neoplasm: Secondary | ICD-10-CM | POA: Diagnosis not present

## 2017-06-10 DIAGNOSIS — Z7982 Long term (current) use of aspirin: Secondary | ICD-10-CM | POA: Insufficient documentation

## 2017-06-10 HISTORY — PX: COLONOSCOPY: SHX5424

## 2017-06-10 SURGERY — COLONOSCOPY
Anesthesia: Moderate Sedation

## 2017-06-10 MED ORDER — MIDAZOLAM HCL 5 MG/5ML IJ SOLN
INTRAMUSCULAR | Status: DC | PRN
  Administered 2017-06-10 (×2): 2 mg via INTRAVENOUS

## 2017-06-10 MED ORDER — MEPERIDINE HCL 50 MG/ML IJ SOLN
INTRAMUSCULAR | Status: DC | PRN
  Administered 2017-06-10 (×2): 25 mg via INTRAVENOUS

## 2017-06-10 MED ORDER — MIDAZOLAM HCL 5 MG/5ML IJ SOLN
INTRAMUSCULAR | Status: AC
  Filled 2017-06-10: qty 10

## 2017-06-10 MED ORDER — MEPERIDINE HCL 50 MG/ML IJ SOLN
INTRAMUSCULAR | Status: AC
  Filled 2017-06-10: qty 1

## 2017-06-10 MED ORDER — STERILE WATER FOR IRRIGATION IR SOLN
Status: DC | PRN
  Administered 2017-06-10: 09:00:00

## 2017-06-10 MED ORDER — SODIUM CHLORIDE 0.9 % IV SOLN: INTRAVENOUS | Status: DC

## 2017-06-10 NOTE — Discharge Instructions (Signed)
Resume usual medications and high fiber diet. °No driving for 24 hours. °

## 2017-06-10 NOTE — H&P (Signed)
Heather Cox is an 71 y.o. female.   Chief Complaint: Patient is here for colonoscopy. HPI: Patient is 71 year old Caucasian female who is here for surveillance colonoscopy. Last exam was in April 2010 with removal of 3 small polyps and one was tubular adenoma. Patient advised to return in 7 years she had knee replacement last year and wasn't able to come. She is doing fine. She denies abdominal pain change in bowel habits or rectal bleeding. Family history is negative for CRC.  Past Medical History:  Diagnosis Date  . Arthritis   . GERD (gastroesophageal reflux disease)    occasionally and will take Tums if needed  . History of colon polyps      . Hyperlipidemia    takes Atorvastatin daily  . Hypertension            Past Surgical History:  Procedure Laterality Date  . ABDOMINAL HYSTERECTOMY  1994  . CARDIAC CATHETERIZATION  2017   03/25/16 ([facility]): Normal coronaries (EF 60-65% by echo)  . cataract surgery Bilateral   . CESAREAN SECTION  1977  . COLONOSCOPY    . KNEE ARTHROPLASTY Right 06/08/2016   Procedure: RIGHT TOTAL KNEE ARTHROPLASTY WITH COMPUTER NAVIGATION;  Surgeon: Samson FredericBrian Swinteck, MD;  Location: [facility] OR;  Service: Orthopedics;  Laterality: Right;  Request RNFA   . TONSILLECTOMY     at age 71  . 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   Procedure: TOTAL KNEE ARTHROPLASTY with Navigation;  Surgeon: Samson FredericBrian Swinteck, MD;  Location: [facility] OR;  Service: Orthopedics;  Laterality: Left;  . WRIST SURGERY Left 1988  . YAG LASER APPLICATION Left 12/30/2015   Procedure: YAG LASER APPLICATION;  Surgeon: Susa Simmondsarroll F Haines, MD;  Location: [facility];  Service: Ophthalmology;  Laterality: Left;  rescheduled to 2/20   . YAG LASER APPLICATION Right 09/04/2016   Procedure: YAG LASER APPLICATION;  Surgeon: Susa Simmondsarroll F Haines, MD;  Location: [facility];  Service: Ophthalmology;  Laterality: Right;    History reviewed. No pertinent family history. Social History:  reports that she has never smoked. She  has never used smokeless tobacco. She reports that she does not drink alcohol or use drugs.  Allergies:  Allergies  Allergen Reactions  . Shrimp [Shellfish Allergy] Anaphylaxis, Itching and Swelling    Tongue swelling, lips numb    Medications Prior to Admission  Medication Sig Dispense Refill  . Aspirin-Caffeine (ANACIN) 400-32 MG TABS Take 2 tablets by mouth 2 (two) times daily as needed (for pain or headache).    Marland Kitchen. atenolol (TENORMIN) 25 MG tablet Take 25 mg by mouth daily.    Marland Kitchen. atorvastatin (LIPITOR) 10 MG tablet Take 10 mg by mouth every evening.     . calcium carbonate (TUMS - DOSED IN MG ELEMENTAL CALCIUM) 500 MG chewable tablet Chew 1 tablet by mouth daily as needed for indigestion or heartburn.    Marland Kitchen. ibuprofen (ADVIL,MOTRIN) 200 MG tablet Take 400 mg by mouth 2 (two) times daily as needed for headache or moderate pain.    Marland Kitchen. amoxicillin (AMOXIL) 500 MG capsule TAKE 4 CAPSULES BY MOUTH 1-2 HOURS PRIOR TO DENTAL WORK  0    No results found for this or any previous visit (from the past 48 hour(s)). No results found.  ROS  Blood pressure 128/76, pulse 66, temperature 97.7 F (36.5 C), temperature source Oral, resp. rate 15, SpO2 96 %. Physical Exam  Constitutional: She appears well-developed and well-nourished.  HENT:  Mouth/Throat: Oropharynx is clear and moist.  Eyes: Conjunctivae are  normal. No scleral icterus.  Neck: No thyromegaly present.  Cardiovascular: Normal rate, regular rhythm and normal heart sounds.   No murmur heard. Respiratory: Effort normal and breath sounds normal.  GI:  Abdomen is full. Lower midline scar noted. Abdomen is soft and nontender without organomegaly or masses.  Musculoskeletal: She exhibits no edema.  Lymphadenopathy:    She has no cervical adenopathy.  Neurological: She is alert.  Skin: Skin is warm and dry.     Assessment/Plan History of colonic adenoma. Surveillance colonoscopy.  Lionel DecemberNajeeb Xitlali Kastens, MD 06/10/2017, 8:34 AM

## 2017-06-10 NOTE — Op Note (Signed)
[facility] Patient Name: Heather Lemmingsatricia Sao Procedure Date: 06/10/2017 8:19 AM MRN: [id number] Date of Birth: 12/09/1945 Attending MD: Lionel DecemberNajeeb Rondo Spittler , MD CSN: [id number] Age: 71 Admit Type: Outpatient Procedure:                Colonoscopy Indications:              High risk colon cancer surveillance: Personal                            history of colonic polyps Providers:                Lionel DecemberNajeeb Kamori Kitchens, MD, Loma MessingLurae B. Patsy LagerAlbert RN, RN, Burke Keelsrisann                            Tilley, Technician Referring MD:             Ignatius Speckinghruv B. Vyas, MD Medicines:                Meperidine 50 mg IV, Midazolam 4 mg IV Complications:            No immediate complications. Estimated Blood Loss:     Estimated blood loss: none. Procedure:                Pre-Anesthesia Assessment:                           - Prior to the procedure, a History and Physical                            was performed, and patient medications and                            allergies were reviewed. The patient's tolerance of                            previous anesthesia was also reviewed. The risks                            and benefits of the procedure and the sedation                            options and risks were discussed with the patient.                            All questions were answered, and informed consent                            was obtained. Prior Anticoagulants: The patient                            last took aspirin 7 days and ibuprofen 7 days prior                            to the procedure. ASA Grade Assessment: II - A  patient with mild systemic disease. After reviewing                            the risks and benefits, the patient was deemed in                            satisfactory condition to undergo the procedure.                           After obtaining informed consent, the colonoscope                            was passed under direct vision. Throughout the         procedure, the patient's blood pressure, pulse, and                            oxygen saturations were monitored continuously. The                            [id number] ([id number]) was introduced through the                            anus and advanced to the the cecum, identified by                            appendiceal orifice and ileocecal valve. The                            colonoscopy was performed without difficulty. The                            patient tolerated the procedure well. The quality                            of the bowel preparation was excellent. The                            ileocecal valve, appendiceal orifice, and rectum                            were photographed. Scope In: 8:44:44 AM Scope Out: 8:56:44 AM Scope Withdrawal Time: 0 hours 7 minutes 13 seconds  Total Procedure Duration: 0 hours 12 minutes 0 seconds  Findings:      The perianal and digital rectal examinations were normal.      A few small-mouthed diverticula were found in the sigmoid colon.      The exam was otherwise without abnormality.      External and internal hemorrhoids were found during retroflexion. The       hemorrhoids were small. Impression:               - Diverticulosis in the sigmoid colon.                           - The examination was otherwise normal.                           -  External and internal hemorrhoids.                           - No specimens collected. Moderate Sedation:      Moderate (conscious) sedation was administered by the endoscopy nurse       and supervised by the endoscopist. The following parameters were       monitored: oxygen saturation, heart rate, blood pressure, CO2       capnography and response to care. Total physician intraservice time was       17 minutes. Recommendation:           - Patient has a contact number available for                            emergencies. The signs and symptoms of potential                            delayed  complications were discussed with the                            patient. Return to normal activities tomorrow.                            Written discharge instructions were provided to the                            patient.                           - High fiber diet today.                           - Continue present medications.                           - No repeat colonoscopy due to age and the absence                            of advanced adenomas. Procedure Code(s):        --- Professional ---                           [phone], Colonoscopy, flexible; diagnostic, including                            collection of specimen(s) by brushing or washing,                            when performed (separate procedure)                           99152, Moderate sedation services provided by the                            same physician or other qualified health care  professional performing the diagnostic or                            therapeutic service that the sedation supports,                            requiring the presence of an independent trained                            observer to assist in the monitoring of the                            patient's level of consciousness and physiological                            status; initial 15 minutes of intraservice time,                            patient age 7 years or older Diagnosis Code(s):        --- Professional ---                           Z86.010, Personal history of colonic polyps                           K64.8, Other hemorrhoids                           K57.30, Diverticulosis of large intestine without                            perforation or abscess without bleeding CPT copyright 2016 [employer]. All rights reserved. The codes documented in this report are preliminary and upon coder review may  be revised to meet current compliance requirements. Lionel December, MD Lionel December, MD 06/10/2017 9:02:53 AM This report has been signed electronically. Number of Addenda: 0

## 2017-06-14 ENCOUNTER — Encounter (HOSPITAL_COMMUNITY): Payer: Self-pay | Admitting: Internal Medicine

## 2017-07-30 DIAGNOSIS — Z299 Encounter for prophylactic measures, unspecified: Secondary | ICD-10-CM | POA: Diagnosis not present

## 2017-07-30 DIAGNOSIS — E7800 Pure hypercholesterolemia, unspecified: Secondary | ICD-10-CM | POA: Diagnosis not present

## 2017-07-30 DIAGNOSIS — I10 Essential (primary) hypertension: Secondary | ICD-10-CM | POA: Diagnosis not present

## 2017-07-30 DIAGNOSIS — Z6841 Body Mass Index (BMI) 40.0 and over, adult: Secondary | ICD-10-CM | POA: Diagnosis not present

## 2017-07-30 DIAGNOSIS — Z713 Dietary counseling and surveillance: Secondary | ICD-10-CM | POA: Diagnosis not present

## 2017-08-12 DIAGNOSIS — Z6841 Body Mass Index (BMI) 40.0 and over, adult: Secondary | ICD-10-CM | POA: Diagnosis not present

## 2017-08-12 DIAGNOSIS — Z7189 Other specified counseling: Secondary | ICD-10-CM | POA: Diagnosis not present

## 2017-08-12 DIAGNOSIS — Z79899 Other long term (current) drug therapy: Secondary | ICD-10-CM | POA: Diagnosis not present

## 2017-08-12 DIAGNOSIS — Z0000 Encounter for general adult medical examination without abnormal findings: Secondary | ICD-10-CM | POA: Diagnosis not present

## 2017-08-12 DIAGNOSIS — Z1339 Encounter for screening examination for other mental health and behavioral disorders: Secondary | ICD-10-CM | POA: Diagnosis not present

## 2017-08-12 DIAGNOSIS — E7800 Pure hypercholesterolemia, unspecified: Secondary | ICD-10-CM | POA: Diagnosis not present

## 2017-08-12 DIAGNOSIS — M8580 Other specified disorders of bone density and structure, unspecified site: Secondary | ICD-10-CM | POA: Diagnosis not present

## 2017-08-12 DIAGNOSIS — Z1331 Encounter for screening for depression: Secondary | ICD-10-CM | POA: Diagnosis not present

## 2017-08-12 DIAGNOSIS — Z299 Encounter for prophylactic measures, unspecified: Secondary | ICD-10-CM | POA: Diagnosis not present

## 2017-08-12 DIAGNOSIS — R5383 Other fatigue: Secondary | ICD-10-CM | POA: Diagnosis not present

## 2017-08-12 DIAGNOSIS — I10 Essential (primary) hypertension: Secondary | ICD-10-CM | POA: Diagnosis not present

## 2017-08-12 DIAGNOSIS — Z23 Encounter for immunization: Secondary | ICD-10-CM | POA: Diagnosis not present

## 2017-11-18 DIAGNOSIS — R928 Other abnormal and inconclusive findings on diagnostic imaging of breast: Secondary | ICD-10-CM | POA: Diagnosis not present

## 2017-11-18 DIAGNOSIS — Z1231 Encounter for screening mammogram for malignant neoplasm of breast: Secondary | ICD-10-CM | POA: Diagnosis not present

## 2017-12-01 DIAGNOSIS — Z96652 Presence of left artificial knee joint: Secondary | ICD-10-CM | POA: Diagnosis not present

## 2017-12-01 DIAGNOSIS — Z471 Aftercare following joint replacement surgery: Secondary | ICD-10-CM | POA: Diagnosis not present

## 2017-12-01 DIAGNOSIS — M1712 Unilateral primary osteoarthritis, left knee: Secondary | ICD-10-CM | POA: Diagnosis not present

## 2017-12-08 DIAGNOSIS — R922 Inconclusive mammogram: Secondary | ICD-10-CM | POA: Diagnosis not present

## 2017-12-08 DIAGNOSIS — R928 Other abnormal and inconclusive findings on diagnostic imaging of breast: Secondary | ICD-10-CM | POA: Diagnosis not present

## 2018-08-24 DIAGNOSIS — Z299 Encounter for prophylactic measures, unspecified: Secondary | ICD-10-CM | POA: Diagnosis not present

## 2018-08-24 DIAGNOSIS — Z0000 Encounter for general adult medical examination without abnormal findings: Secondary | ICD-10-CM | POA: Diagnosis not present

## 2018-08-24 DIAGNOSIS — Z7189 Other specified counseling: Secondary | ICD-10-CM | POA: Diagnosis not present

## 2018-08-24 DIAGNOSIS — Z1339 Encounter for screening examination for other mental health and behavioral disorders: Secondary | ICD-10-CM | POA: Diagnosis not present

## 2018-08-24 DIAGNOSIS — Z79899 Other long term (current) drug therapy: Secondary | ICD-10-CM | POA: Diagnosis not present

## 2018-08-24 DIAGNOSIS — I10 Essential (primary) hypertension: Secondary | ICD-10-CM | POA: Diagnosis not present

## 2018-08-24 DIAGNOSIS — Z1331 Encounter for screening for depression: Secondary | ICD-10-CM | POA: Diagnosis not present

## 2018-08-24 DIAGNOSIS — Z6841 Body Mass Index (BMI) 40.0 and over, adult: Secondary | ICD-10-CM | POA: Diagnosis not present

## 2018-08-24 DIAGNOSIS — Z23 Encounter for immunization: Secondary | ICD-10-CM | POA: Diagnosis not present

## 2018-08-24 DIAGNOSIS — E7800 Pure hypercholesterolemia, unspecified: Secondary | ICD-10-CM | POA: Diagnosis not present

## 2018-08-24 DIAGNOSIS — R5383 Other fatigue: Secondary | ICD-10-CM | POA: Diagnosis not present

## 2018-10-25 DIAGNOSIS — M25552 Pain in left hip: Secondary | ICD-10-CM | POA: Diagnosis not present

## 2018-10-25 DIAGNOSIS — M1612 Unilateral primary osteoarthritis, left hip: Secondary | ICD-10-CM | POA: Diagnosis not present

## 2018-11-16 DIAGNOSIS — M25552 Pain in left hip: Secondary | ICD-10-CM | POA: Diagnosis not present

## 2018-11-29 DIAGNOSIS — M1612 Unilateral primary osteoarthritis, left hip: Secondary | ICD-10-CM | POA: Diagnosis not present

## 2019-06-07 DIAGNOSIS — M1612 Unilateral primary osteoarthritis, left hip: Secondary | ICD-10-CM | POA: Diagnosis not present

## 2019-09-01 DIAGNOSIS — Z7189 Other specified counseling: Secondary | ICD-10-CM | POA: Diagnosis not present

## 2019-09-01 DIAGNOSIS — Z1339 Encounter for screening examination for other mental health and behavioral disorders: Secondary | ICD-10-CM | POA: Diagnosis not present

## 2019-09-01 DIAGNOSIS — R5383 Other fatigue: Secondary | ICD-10-CM | POA: Diagnosis not present

## 2019-09-01 DIAGNOSIS — Z0000 Encounter for general adult medical examination without abnormal findings: Secondary | ICD-10-CM | POA: Diagnosis not present

## 2019-09-01 DIAGNOSIS — I10 Essential (primary) hypertension: Secondary | ICD-10-CM | POA: Diagnosis not present

## 2019-09-01 DIAGNOSIS — Z299 Encounter for prophylactic measures, unspecified: Secondary | ICD-10-CM | POA: Diagnosis not present

## 2019-09-01 DIAGNOSIS — Z1331 Encounter for screening for depression: Secondary | ICD-10-CM | POA: Diagnosis not present

## 2019-09-01 DIAGNOSIS — E7800 Pure hypercholesterolemia, unspecified: Secondary | ICD-10-CM | POA: Diagnosis not present

## 2019-09-01 DIAGNOSIS — Z6841 Body Mass Index (BMI) 40.0 and over, adult: Secondary | ICD-10-CM | POA: Diagnosis not present

## 2019-09-01 DIAGNOSIS — Z79899 Other long term (current) drug therapy: Secondary | ICD-10-CM | POA: Diagnosis not present

## 2019-09-01 DIAGNOSIS — E8940 Asymptomatic postprocedural ovarian failure: Secondary | ICD-10-CM | POA: Diagnosis not present

## 2019-09-01 DIAGNOSIS — Z1211 Encounter for screening for malignant neoplasm of colon: Secondary | ICD-10-CM | POA: Diagnosis not present

## 2019-11-14 DIAGNOSIS — Z1231 Encounter for screening mammogram for malignant neoplasm of breast: Secondary | ICD-10-CM | POA: Diagnosis not present

## 2020-01-05 DIAGNOSIS — M25562 Pain in left knee: Secondary | ICD-10-CM | POA: Diagnosis not present

## 2020-01-05 DIAGNOSIS — M1612 Unilateral primary osteoarthritis, left hip: Secondary | ICD-10-CM | POA: Diagnosis not present

## 2020-01-05 DIAGNOSIS — M25561 Pain in right knee: Secondary | ICD-10-CM | POA: Diagnosis not present

## 2020-01-24 DIAGNOSIS — M25552 Pain in left hip: Secondary | ICD-10-CM | POA: Diagnosis not present

## 2020-09-09 DIAGNOSIS — I10 Essential (primary) hypertension: Secondary | ICD-10-CM | POA: Diagnosis not present

## 2020-09-09 DIAGNOSIS — Z23 Encounter for immunization: Secondary | ICD-10-CM | POA: Diagnosis not present

## 2020-09-09 DIAGNOSIS — Z7189 Other specified counseling: Secondary | ICD-10-CM | POA: Diagnosis not present

## 2020-09-09 DIAGNOSIS — Z6841 Body Mass Index (BMI) 40.0 and over, adult: Secondary | ICD-10-CM | POA: Diagnosis not present

## 2020-09-09 DIAGNOSIS — R5383 Other fatigue: Secondary | ICD-10-CM | POA: Diagnosis not present

## 2020-09-09 DIAGNOSIS — Z299 Encounter for prophylactic measures, unspecified: Secondary | ICD-10-CM | POA: Diagnosis not present

## 2020-09-09 DIAGNOSIS — Z79899 Other long term (current) drug therapy: Secondary | ICD-10-CM | POA: Diagnosis not present

## 2020-09-09 DIAGNOSIS — Z1331 Encounter for screening for depression: Secondary | ICD-10-CM | POA: Diagnosis not present

## 2020-09-09 DIAGNOSIS — Z0000 Encounter for general adult medical examination without abnormal findings: Secondary | ICD-10-CM | POA: Diagnosis not present

## 2020-09-09 DIAGNOSIS — Z1339 Encounter for screening examination for other mental health and behavioral disorders: Secondary | ICD-10-CM | POA: Diagnosis not present

## 2020-09-09 DIAGNOSIS — E7800 Pure hypercholesterolemia, unspecified: Secondary | ICD-10-CM | POA: Diagnosis not present

## 2020-10-17 DIAGNOSIS — M7062 Trochanteric bursitis, left hip: Secondary | ICD-10-CM | POA: Diagnosis not present

## 2020-10-17 DIAGNOSIS — M25512 Pain in left shoulder: Secondary | ICD-10-CM | POA: Diagnosis not present

## 2020-10-29 DIAGNOSIS — M25512 Pain in left shoulder: Secondary | ICD-10-CM | POA: Diagnosis not present

## 2020-12-13 ENCOUNTER — Other Ambulatory Visit: Payer: Self-pay

## 2020-12-13 ENCOUNTER — Encounter (HOSPITAL_BASED_OUTPATIENT_CLINIC_OR_DEPARTMENT_OTHER): Payer: Self-pay | Admitting: Emergency Medicine

## 2020-12-13 ENCOUNTER — Emergency Department (HOSPITAL_BASED_OUTPATIENT_CLINIC_OR_DEPARTMENT_OTHER)
Admission: EM | Admit: 2020-12-13 | Discharge: 2020-12-13 | Disposition: A | Discharge status: Home / Self Care | Payer: Medicare Other | Attending: Emergency Medicine | Admitting: Emergency Medicine

## 2020-12-13 DIAGNOSIS — S0591XA Unspecified injury of right eye and orbit, initial encounter: Secondary | ICD-10-CM | POA: Diagnosis present

## 2020-12-13 DIAGNOSIS — X58XXXA Exposure to other specified factors, initial encounter: Secondary | ICD-10-CM | POA: Diagnosis not present

## 2020-12-13 DIAGNOSIS — B0239 Other herpes zoster eye disease: Secondary | ICD-10-CM | POA: Insufficient documentation

## 2020-12-13 DIAGNOSIS — I10 Essential (primary) hypertension: Secondary | ICD-10-CM | POA: Diagnosis not present

## 2020-12-13 DIAGNOSIS — Z79899 Other long term (current) drug therapy: Secondary | ICD-10-CM | POA: Diagnosis not present

## 2020-12-13 DIAGNOSIS — Z96653 Presence of artificial knee joint, bilateral: Secondary | ICD-10-CM | POA: Insufficient documentation

## 2020-12-13 DIAGNOSIS — Z7982 Long term (current) use of aspirin: Secondary | ICD-10-CM | POA: Diagnosis not present

## 2020-12-13 DIAGNOSIS — S0501XA Injury of conjunctiva and corneal abrasion without foreign body, right eye, initial encounter: Secondary | ICD-10-CM | POA: Insufficient documentation

## 2020-12-13 DIAGNOSIS — B0230 Zoster ocular disease, unspecified: Secondary | ICD-10-CM

## 2020-12-13 MED ORDER — ACYCLOVIR 400 MG PO TABS: 800.0000 mg | ORAL_TABLET | Freq: Every day | ORAL | 0 refills | Status: AC

## 2020-12-13 MED ORDER — ERYTHROMYCIN 5 MG/GM OP OINT
TOPICAL_OINTMENT | Freq: Once | OPHTHALMIC | Status: AC
  Administered 2020-12-13: 1 via OPHTHALMIC
  Filled 2020-12-13: qty 3.5

## 2020-12-13 MED ORDER — FLUORESCEIN SODIUM 1 MG OP STRP
1.0000 | ORAL_STRIP | Freq: Once | OPHTHALMIC | Status: AC
  Administered 2020-12-13: 1 via OPHTHALMIC
  Filled 2020-12-13: qty 1

## 2020-12-13 MED ORDER — TETRACAINE HCL 0.5 % OP SOLN
2.0000 [drp] | Freq: Once | OPHTHALMIC | Status: AC
  Administered 2020-12-13: 2 [drp] via OPHTHALMIC
  Filled 2020-12-13: qty 4

## 2020-12-13 NOTE — ED Provider Notes (Signed)
[facility] EMERGENCY DEPARTMENT Provider Note   CSN: [id number] Arrival date & time: 12/13/20  1028     History Chief Complaint  Patient presents with  . Herpes Zoster    Heather Cox is a 75 y.o. female with PMHx HTN, HLD, GERD who presents to the ED today with concern for herpes zoster. Pt was seen at UC this morning for pain/swelling and redness to her right eyelid that began 2 days ago. Pt reports that since then she has noticed "blotchy" areas to her right forehead that are painful to the touch. She began noticing a FB sensation to her right eye this morning prompting UC eval. UC was concerned for possible shingles and sent the pt to the ED for further evaluation. Pt denies any blurry vision, double vision. She does mention the eye has been draining watery material. No other complaints at this time. Pt states she has been vaccinated for shingles in the past however it has been multiple years. She did have chicken pox as a child.   The history is provided by the patient and medical records.       Past Medical History:  Diagnosis Date  . Arthritis   . GERD (gastroesophageal reflux disease)    occasionally and will take Tums if needed  . History of colon polyps    benign  . Hyperlipidemia    takes Atorvastatin daily  . Hypertension    takes Atenolol daily  . Joint pain     Patient Active Problem List   Diagnosis Date Noted  . Hx of colonic polyps 02/11/2017  . Osteoarthritis of left knee 11/30/2016  . Degenerative arthritis of left knee 11/30/2016    Past Surgical History:  Procedure Laterality Date  . ABDOMINAL HYSTERECTOMY  1994  . CARDIAC CATHETERIZATION  2017   03/25/16 ([facility]): Normal coronaries (EF 60-65% by echo)  . cataract surgery Bilateral   . CESAREAN SECTION  1977  . COLONOSCOPY    . COLONOSCOPY N/A 06/10/2017   Procedure: COLONOSCOPY;  Surgeon: Malissa Hippo, MD;  [facility]: [facility];  Service: Endoscopy;  Laterality: N/A;  830  .  KNEE ARTHROPLASTY Right 06/08/2016   Procedure: RIGHT TOTAL KNEE ARTHROPLASTY WITH COMPUTER NAVIGATION;  Surgeon: Samson Frederic, MD;  Location: [facility] OR;  Service: Orthopedics;  Laterality: Right;  Request RNFA   . TONSILLECTOMY     at age 22  . 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   Procedure: TOTAL KNEE ARTHROPLASTY with Navigation;  Surgeon: Samson Frederic, MD;  Location: [facility] OR;  Service: Orthopedics;  Laterality: Left;  . WRIST SURGERY Left 1988  . YAG LASER APPLICATION Left 12/30/2015   Procedure: YAG LASER APPLICATION;  Surgeon: Susa Simmonds, MD;  Location: [facility];  Service: Ophthalmology;  Laterality: Left;  rescheduled to 2/20   . YAG LASER APPLICATION Right 09/04/2016   Procedure: YAG LASER APPLICATION;  Surgeon: Susa Simmonds, MD;  Location: [facility];  Service: Ophthalmology;  Laterality: Right;     OB History   No obstetric history on file.     No family history on file.  Social History   Tobacco Use  . Smoking status: Never Smoker  . Smokeless tobacco: Never Used  Substance Use Topics  . Alcohol use: No  . Drug use: No    Home Medications Prior to Admission medications   Medication Sig Start Date End Date Taking? Authorizing Provider  acyclovir (ZOVIRAX) 400 MG tablet Take 2 tablets (800 mg total) by  mouth 5 (five) times daily for 7 days. 12/13/20 12/20/20 Yes Teigen Parslow, PA-C  Aspirin-Caffeine 400-32 MG TABS Take 2 tablets by mouth 2 (two) times daily as needed (for pain or headache).   Yes [provider]  atenolol (TENORMIN) 25 MG tablet Take 25 mg by mouth daily.   Yes [provider]  atorvastatin (LIPITOR) 10 MG tablet Take 10 mg by mouth every evening.    Yes [provider]  calcium carbonate (TUMS - DOSED IN MG ELEMENTAL CALCIUM) 500 MG chewable tablet Chew 1 tablet by mouth daily as needed for indigestion or heartburn.   Yes [provider]  ibuprofen (ADVIL,MOTRIN) 200 MG tablet Take 400 mg by mouth 2 (two) times  daily as needed for headache or moderate pain.   Yes [provider]  amoxicillin (AMOXIL) 500 MG capsule TAKE 4 CAPSULES BY MOUTH 1-2 HOURS PRIOR TO DENTAL WORK 03/15/17   [provider]    Allergies    Shrimp [shellfish allergy]  Review of Systems   Review of Systems  Constitutional: Negative for chills and fever.  Eyes: Positive for pain and redness. Negative for visual disturbance.  Skin: Positive for rash.  All other systems reviewed and are negative.   Physical Exam Updated Vital Signs BP (!) 162/97 (BP Location: Right Arm)   Pulse 86   Temp 98.1 F (36.7 C) (Oral)   Resp 18   Ht 5\' 2"  (1.575 m)   Wt 99.8 kg   SpO2 97%   BMI 40.24 kg/m   Physical Exam Vitals and nursing note reviewed.  Constitutional:      Appearance: She is not ill-appearing.  HENT:     Head: Normocephalic and atraumatic.  Eyes:     Intraocular pressure: Right eye pressure is 19 mmHg.     Comments: Mild conjunctival injection noted to R eye; PERRL. Right upper eyelid swollen with erythema; no TTP; no hordeolum appreciated.   + Fluorescein uptake on exam.   Visual Acuity Bilateral Distance:20/50 R Distance:20/50 L Distance:20/50  Cardiovascular:     Rate and Rhythm: Normal rate and regular rhythm.  Pulmonary:     Effort: Pulmonary effort is normal.     Breath sounds: Normal breath sounds. No wheezing, rhonchi or rales.  Abdominal:     Palpations: Abdomen is soft.     Tenderness: There is no abdominal tenderness.  Musculoskeletal:     Cervical back: Neck supple.  Skin:    General: Skin is warm and dry.     Findings: Rash present.     Comments: Maculopapular rash noted to right forehead; does cross the midline with 2-3 small closed vesicular lesions noted  Neurological:     Mental Status: She is alert.        ED Results / Procedures / Treatments   Labs (all labs ordered are listed, but only abnormal results are displayed) Labs Reviewed - No data to  display  EKG None  Radiology No results found.  Procedures Procedures   Medications Ordered in ED Medications  fluorescein ophthalmic strip 1 strip (1 strip Right Eye Given 12/13/20 1115)  tetracaine (PONTOCAINE) 0.5 % ophthalmic solution 2 drop (2 drops Right Eye Given 12/13/20 1114)  erythromycin ophthalmic ointment (1 application Right Eye Given 12/13/20 1211)    ED Course  I have reviewed the triage vital signs and the nursing notes.  Pertinent labs & imaging results that were available during my care of the patient were reviewed by me and considered  in my medical decision making (see chart for details).  Clinical Course as of 12/13/20 1219  Fri Dec 13, 2020  1203 Discussed case with Ophthalmologist Dr. Allyne Gee - recommends 1 week acyclovir and artificial tears and if no improvement can be seen in the office. Have discussed + fluorescein uptake with erythromycin which he agrees with.  [MV]    Clinical Course User Index [MV] Tanda Rockers, PA-C   MDM Rules/Calculators/A&P                          75 year old female who presents to the ED today after being sent from urgent care with concern for shingles with ophthalmic involvement.  Began having right eye lid pain and irritation 2 days ago with rash that is painful to the touch on forehead.  She also noticed a foreign body sensation to her right eye this morning.  No visual impairment.  Patient does wear glasses.  On arrival to the ED vitals are stable.  Visual acuity unchanged 20/50 bilaterally with 20/50 in both right and left eye.  She does have positive fluorescein uptake on exam at this time.  Normal pressure at 19 mmHg. her rash does cross the midline but she does have 2-3 closed vesicle type lesions on her forehead as well, may be early zoster.  Will consult ophthalmology at this time for further recommendations.   Discussed case with Dr. Allyne Gee - see clinical course above. Erythromycin ointment provided at this time. Will  discharge with acyclovir. Pt instructed to follow up with ophthalmology if no improvement in symptoms. She is in agreement with plan and stable for discharge home.   This note was prepared using Dragon voice recognition software and may include unintentional dictation errors due to the inherent limitations of voice recognition software.  Final Clinical Impression(s) / ED Diagnoses Final diagnoses:  Abrasion of right cornea, initial encounter  Herpes zoster ophthalmicus of right eye    Rx / DC Orders ED Discharge Orders         Ordered    acyclovir (ZOVIRAX) 400 MG tablet  5 times daily        12/13/20 1215           Discharge Instructions     Please pick up medication and take as prescribed. Take every 4 hours (5 times per day) for the next week for your shingles.  Use the antibiotic eye drop as indicated - 4 times per day for the next 1 week.   If no improvement in symptoms please follow up with Dr. Allyne Gee at [facility] for further evaluation - you will need to call their office to schedule an appointment.   Please also follow up with your PCP regarding your ED visit  Return to the ED for any worsening symptoms       Tanda Rockers, PA-C 12/13/20 1219    Long, Arlyss Repress, MD 12/20/20 1954

## 2020-12-13 NOTE — Discharge Instructions (Addendum)
Please pick up medication and take as prescribed. Take every 4 hours (5 times per day) for the next week for your shingles.  Use the antibiotic eye drop as indicated - 4 times per day for the next 1 week.   If no improvement in symptoms please follow up with Dr. Allyne Gee at [facility] for further evaluation - you will need to call their office to schedule an appointment.   Please also follow up with your PCP regarding your ED visit  Return to the ED for any worsening symptoms

## 2020-12-13 NOTE — ED Notes (Signed)
All eyes the same 20/50

## 2020-12-13 NOTE — ED Triage Notes (Signed)
Reports pain and redness in right eye that radiates into scalp on the right side.  Was seen at [facility].  Sent here for further evaluation.

## 2020-12-13 NOTE — ED Notes (Signed)
Patient verbalized understanding of dc instructions, prescriptions, follow up referrals and reasons to return to ER for reevaluation.  

## 2021-04-04 ENCOUNTER — Other Ambulatory Visit: Payer: Self-pay

## 2021-04-04 ENCOUNTER — Ambulatory Visit (INDEPENDENT_AMBULATORY_CARE_PROVIDER_SITE_OTHER): Payer: Medicare Other | Admitting: Family

## 2021-04-04 ENCOUNTER — Encounter: Payer: Self-pay | Admitting: Family

## 2021-04-04 VITALS — BP 138/78 | HR 74 | Temp 98.0°F | Ht 62.0 in | Wt 220.4 lb

## 2021-04-04 DIAGNOSIS — Z23 Encounter for immunization: Secondary | ICD-10-CM | POA: Diagnosis not present

## 2021-04-04 DIAGNOSIS — E782 Mixed hyperlipidemia: Secondary | ICD-10-CM

## 2021-04-04 DIAGNOSIS — H9190 Unspecified hearing loss, unspecified ear: Secondary | ICD-10-CM

## 2021-04-04 DIAGNOSIS — W540XXA Bitten by dog, initial encounter: Secondary | ICD-10-CM

## 2021-04-04 DIAGNOSIS — I10 Essential (primary) hypertension: Secondary | ICD-10-CM | POA: Diagnosis not present

## 2021-04-04 DIAGNOSIS — L03116 Cellulitis of left lower limb: Secondary | ICD-10-CM

## 2021-04-04 MED ORDER — DOXYCYCLINE HYCLATE 100 MG PO TABS: 100.0000 mg | ORAL_TABLET | Freq: Two times a day (BID) | ORAL | 0 refills | Status: DC

## 2021-04-04 NOTE — Progress Notes (Signed)
Heather Cox is a 75 y.o. female with the following history as recorded in Epic[employer]:  Patient Active Problem List   Diagnosis Date Noted  . Hx of colonic polyps 02/11/2017  . Osteoarthritis of left knee 11/30/2016  . Degenerative arthritis of left knee 11/30/2016    Current Outpatient Medications  Medication Sig Dispense Refill  . amoxicillin (AMOXIL) 500 MG capsule TAKE 4 CAPSULES BY MOUTH 1-2 HOURS PRIOR TO DENTAL WORK  0  . Aspirin-Caffeine 400-32 MG TABS Take 2 tablets by mouth 2 (two) times daily as needed (for pain or headache).    Marland Kitchen atenolol (TENORMIN) 25 MG tablet Take 25 mg by mouth daily.    Marland Kitchen atorvastatin (LIPITOR) 10 MG tablet Take 10 mg by mouth every evening.     . calcium carbonate (TUMS - DOSED IN MG ELEMENTAL CALCIUM) 500 MG chewable tablet Chew 1 tablet by mouth daily as needed for indigestion or heartburn.    . doxycycline (VIBRA-TABS) 100 MG tablet Take 1 tablet (100 mg total) by mouth 2 (two) times daily. 20 tablet 0  . ibuprofen (ADVIL,MOTRIN) 200 MG tablet Take 400 mg by mouth 2 (two) times daily as needed for headache or moderate pain.     No current facility-administered medications for this visit.    Allergies: Shrimp [shellfish allergy]  Past Medical History:  Diagnosis Date  . Arthritis   . GERD (gastroesophageal reflux disease)    occasionally and will take Tums if needed  . History of colon polyps    benign  . Hyperlipidemia    takes Atorvastatin daily  . Hypertension    takes Atenolol daily  . Joint pain     Past Surgical History:  Procedure Laterality Date  . ABDOMINAL HYSTERECTOMY  1994  . CARDIAC CATHETERIZATION  2017   03/25/16 ([facility]): Normal coronaries (EF 60-65% by echo)  . cataract surgery Bilateral   . CESAREAN SECTION  1977  . COLONOSCOPY    . COLONOSCOPY N/A 06/10/2017   Procedure: COLONOSCOPY;  Surgeon: Malissa Hippo, MD;  [facility];  Service: Endoscopy;  Laterality: N/A;  830  . KNEE ARTHROPLASTY Right  06/08/2016   Procedure: RIGHT TOTAL KNEE ARTHROPLASTY WITH COMPUTER NAVIGATION;  Surgeon: Samson Frederic, MD;  Location: [facility];  Service: Orthopedics;  Laterality: Right;  Request RNFA   . TONSILLECTOMY     at age 79  . 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   Procedure: TOTAL KNEE ARTHROPLASTY with Navigation;  Surgeon: Samson Frederic, MD;  Location: [facility] OR;  Service: Orthopedics;  Laterality: Left;  . WRIST SURGERY Left 1988  . YAG LASER APPLICATION Left 12/30/2015   Procedure: YAG LASER APPLICATION;  Surgeon: Susa Simmonds, MD;  Location: [facility];  Service: Ophthalmology;  Laterality: Left;  rescheduled to 2/20   . YAG LASER APPLICATION Right 09/04/2016   Procedure: YAG LASER APPLICATION;  Surgeon: Susa Simmonds, MD;  Location: [facility];  Service: Ophthalmology;  Laterality: Right;    No family history on file.  Social History   Tobacco Use  . Smoking status: Never Smoker  . Smokeless tobacco: Never Used  Substance Use Topics  . Alcohol use: No    Subjective:  Patient presents today as a new patient; has recently moved from [place]- is up to date on preventive healthcare; last exam was done in November 2021;   3 months ago was bitten by daughter's dogs while she was feeding; does know that dogs are up to date on Rabies vaccines;  however area where she was bitten is localized on back of left lower leg; has not healed in 3 months- continuing to drain/ ooze pustular drainage; denies any pain and notes that area of concern is actually improving in size; no fever, night sweats or streaking from wound;   Also mentions concerns that hearing is changing and would be interested in evaluation for hearing aids;     Objective:  Vitals:   04/04/21 1402  BP: 138/78  Pulse: 74  Temp: 98 F (36.7 C)  TempSrc: Oral  SpO2: 99%  Weight: 220 lb 6.4 oz (100 kg)  Height: 5\' 2"  (1.575 m)    General: Well developed, well nourished, in no acute distress  Skin : Warm and dry. Wound noted on lower  left extremity with hard center and pustular drainage noted; no warmth or streaking;  Head: Normocephalic and atraumatic  Eyes: Sclera and conjunctiva clear; pupils round and reactive to light; extraocular movements intact  Lungs: Respirations unlabored; clear to auscultation bilaterally without wheeze, rales, rhonchi  Musculoskeletal: No deformities; no active joint inflammation  Extremities: No edema, cyanosis, clubbing  Vessels: Symmetric bilaterally  Neurologic: Alert and oriented; speech intact; face symmetrical; moves all extremities well; CNII-XII intact without focal deficit   Assessment:  1. Cellulitis of left lower extremity   2. Dog bite, initial encounter   3. Hearing loss, unspecified hearing loss type, unspecified laterality   4. Primary hypertension   5. Mixed hyperlipidemia     Plan:  1. Suspect deeper wound that will need I & D more complicated than can be handled in our office; start Doxycyline to cover for MRSA and refer to wound clinic for further evaluation; 2. Tdap updated; 3. Refer to ENT for further evaluation;   4. Stable; continue same medication; 5. Stable; continue same medication;  Discussed need for mammogram/ DEXA and patient prefers to discuss at wellness exam later this year; agree this is reasonable;   This visit occurred during the SARS-CoV-2 public health emergency.  Safety protocols were in place, including screening questions prior to the visit, additional usage of staff PPE, and extensive cleaning of exam room while observing appropriate contact time as indicated for disinfecting solutions.     No follow-ups on file.  Orders Placed This Encounter  Procedures  . Tdap vaccine greater than or equal to 7yo IM  . AMB referral to wound care center    Referral Priority:   Routine    Referral Type:   Consultation    Referred to Provider:   , MD    Number of Visits Requested:   1  . Ambulatory referral to ENT    Referral Priority:    Routine    Referral Type:   Consultation    Referral Reason:   Specialty Services Required    Requested Specialty:   Otolaryngology    Number of Visits Requested:   1    Requested Prescriptions   Signed Prescriptions Disp Refills  . doxycycline (VIBRA-TABS) 100 MG tablet 20 tablet 0    Sig: Take 1 tablet (100 mg total) by mouth 2 (two) times daily.

## 2021-04-11 ENCOUNTER — Encounter: Payer: Self-pay | Admitting: Family

## 2021-04-15 ENCOUNTER — Encounter: Payer: Self-pay | Admitting: Family

## 2021-04-16 ENCOUNTER — Encounter: Payer: Self-pay | Admitting: Family

## 2021-04-16 ENCOUNTER — Other Ambulatory Visit: Payer: Self-pay | Admitting: Family

## 2021-04-16 MED ORDER — DOXYCYCLINE HYCLATE 100 MG PO TABS: 100.0000 mg | ORAL_TABLET | Freq: Two times a day (BID) | ORAL | 0 refills | Status: DC

## 2021-04-17 ENCOUNTER — Other Ambulatory Visit: Payer: Self-pay | Admitting: Family

## 2021-04-17 ENCOUNTER — Encounter: Payer: Self-pay | Admitting: Family

## 2021-04-18 MED ORDER — DOXYCYCLINE HYCLATE 100 MG PO TABS: 100.0000 mg | ORAL_TABLET | Freq: Two times a day (BID) | ORAL | 0 refills | Status: DC

## 2021-07-02 ENCOUNTER — Encounter (HOSPITAL_BASED_OUTPATIENT_CLINIC_OR_DEPARTMENT_OTHER): Payer: Self-pay

## 2021-07-02 ENCOUNTER — Emergency Department (HOSPITAL_BASED_OUTPATIENT_CLINIC_OR_DEPARTMENT_OTHER): Payer: Medicare Other

## 2021-07-02 ENCOUNTER — Emergency Department (HOSPITAL_BASED_OUTPATIENT_CLINIC_OR_DEPARTMENT_OTHER)
Admission: EM | Admit: 2021-07-02 | Discharge: 2021-07-02 | Disposition: A | Discharge status: Home / Self Care | Payer: Medicare Other | Attending: Emergency Medicine | Admitting: Emergency Medicine

## 2021-07-02 ENCOUNTER — Other Ambulatory Visit: Payer: Self-pay

## 2021-07-02 DIAGNOSIS — I10 Essential (primary) hypertension: Secondary | ICD-10-CM | POA: Insufficient documentation

## 2021-07-02 DIAGNOSIS — Z96653 Presence of artificial knee joint, bilateral: Secondary | ICD-10-CM | POA: Diagnosis not present

## 2021-07-02 DIAGNOSIS — W19XXXA Unspecified fall, initial encounter: Secondary | ICD-10-CM

## 2021-07-02 DIAGNOSIS — Z79899 Other long term (current) drug therapy: Secondary | ICD-10-CM | POA: Diagnosis not present

## 2021-07-02 DIAGNOSIS — S0181XA Laceration without foreign body of other part of head, initial encounter: Secondary | ICD-10-CM | POA: Diagnosis not present

## 2021-07-02 DIAGNOSIS — W01198A Fall on same level from slipping, tripping and stumbling with subsequent striking against other object, initial encounter: Secondary | ICD-10-CM | POA: Diagnosis not present

## 2021-07-02 DIAGNOSIS — Y93K9 Activity, other involving animal care: Secondary | ICD-10-CM | POA: Diagnosis not present

## 2021-07-02 DIAGNOSIS — S022XXA Fracture of nasal bones, initial encounter for closed fracture: Secondary | ICD-10-CM | POA: Diagnosis not present

## 2021-07-02 DIAGNOSIS — S0990XA Unspecified injury of head, initial encounter: Secondary | ICD-10-CM | POA: Diagnosis present

## 2021-07-02 NOTE — ED Provider Notes (Signed)
[facility] EMERGENCY DEPARTMENT Provider Note   CSN: [id number] Arrival date & time: 07/02/21  1138     History Chief Complaint  Patient presents with   Heather Cox    Heather Cox is a 75 y.o. female.  HPI  75 year old female history of arthritis, GERD, colon polyps, hyperlipidemia, hypertension, who presents to the emergency department today for evaluation after a fall.  States she was taking her dog to the groomer's and a did not want ago.  She was trying to pull in by his leash and went in the opposite direction causing her to fall forward hitting her head on concrete.  She has a laceration to her forehead.  She denies any significant pain.  Denies LOC.  Denies being on any anticoagulation.  No neck pain, back pain hip pain or other concerns at this time.  Her Tdap was updated about 6 months ago.  Past Medical History:  Diagnosis Date   Arthritis    GERD (gastroesophageal reflux disease)    occasionally and will take Tums if needed   History of colon polyps    benign   Hyperlipidemia    takes Atorvastatin daily   Hypertension    takes Atenolol daily   Joint pain     Patient Active Problem List   Diagnosis Date Noted   Hx of colonic polyps 02/11/2017   Osteoarthritis of left knee 11/30/2016   Degenerative arthritis of left knee 11/30/2016    Past Surgical History:  Procedure Laterality Date   ABDOMINAL HYSTERECTOMY  1994   CARDIAC CATHETERIZATION  2017   03/25/16 ([facility]): Normal coronaries (EF 60-65% by echo)   cataract surgery Bilateral    CESAREAN SECTION  1977   COLONOSCOPY     COLONOSCOPY N/A 06/10/2017   Procedure: COLONOSCOPY;  Surgeon: Malissa Hippoehman, Najeeb U, MD;  Location: [facility];  Service: Endoscopy;  Laterality: N/A;  830   KNEE ARTHROPLASTY Right 06/08/2016   Procedure: RIGHT TOTAL KNEE ARTHROPLASTY WITH COMPUTER NAVIGATION;  Surgeon: Samson FredericBrian Swinteck, MD;  Location: [facility] OR;  Service: Orthopedics;  Laterality: Right;  Request RNFA     TONSILLECTOMY     at age 75   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   Procedure: TOTAL KNEE ARTHROPLASTY with Navigation;  Surgeon: Samson FredericBrian Swinteck, MD;  Location: [facility] OR;  Service: Orthopedics;  Laterality: Left;   WRIST SURGERY Left 1988   YAG LASER APPLICATION Left 12/30/2015   Procedure: YAG LASER APPLICATION;  Surgeon: Susa Simmondsarroll F Haines, MD;  Location: [facility] ORS;  Service: Ophthalmology;  Laterality: Left;  rescheduled to 2/20    YAG LASER APPLICATION Right 09/04/2016   Procedure: YAG LASER APPLICATION;  Surgeon: Susa Simmondsarroll F Haines, MD;  Location: [facility];  Service: Ophthalmology;  Laterality: Right;     OB History   No obstetric history on file.     No family history on file.  Social History   Tobacco Use   Smoking status: Never   Smokeless tobacco: Never  Substance Use Topics   Alcohol use: No   Drug use: No    Home Medications Prior to Admission medications   Medication Sig Start Date End Date Taking? Authorizing Provider  amoxicillin (AMOXIL) 500 MG capsule TAKE 4 CAPSULES BY MOUTH 1-2 HOURS PRIOR TO DENTAL WORK 03/15/17   [provider]  Aspirin-Caffeine 400-32 MG TABS Take 2 tablets by mouth 2 (two) times daily as needed (for pain or headache).    [provider]  atenolol (TENORMIN) 25 MG tablet  Take 25 mg by mouth daily.    [provider]  atorvastatin (LIPITOR) 10 MG tablet Take 10 mg by mouth every evening.     [provider]  calcium carbonate (TUMS - DOSED IN MG ELEMENTAL CALCIUM) 500 MG chewable tablet Chew 1 tablet by mouth daily as needed for indigestion or heartburn.    [provider]  doxycycline (VIBRA-TABS) 100 MG tablet Take 1 tablet (100 mg total) by mouth 2 (two) times daily. 04/18/21   Heather Bass, FNP  ibuprofen (ADVIL,MOTRIN) 200 MG tablet Take 400 mg by mouth 2 (two) times daily as needed for headache or moderate pain.    [provider]    Allergies    Shrimp [shellfish allergy]  Review  of Systems   Review of Systems  Constitutional:  Negative for fever.  Eyes:  Negative for visual disturbance.  Respiratory:  Negative for shortness of breath.   Cardiovascular:  Negative for chest pain.  Gastrointestinal:  Negative for abdominal pain.  Genitourinary:  Negative for pelvic pain.  Musculoskeletal:  Negative for back pain and neck pain.  Skin:  Positive for wound.  Neurological:  Negative for syncope, weakness, light-headedness and headaches.       Head injury, no loc   Physical Exam Updated Vital Signs BP (!) 148/82 (BP Location: Right Arm)   Pulse 73   Temp 97.7 F (36.5 C)   Resp 18   Ht 5\' 2"  (1.575 m)   Wt 98.4 kg   SpO2 96%   BMI 39.69 kg/m   Physical Exam Vitals and nursing note reviewed.  Constitutional:      General: She is not in acute distress.    Appearance: She is well-developed.  HENT:     Head: Normocephalic.     Nose:     Comments: Swelling noted to the nose but it is in good alignment. Abrasions noted to the nose and there is a 1cm superficial curved laceration between the bilat eyebrows. No nasal septal hematoma noted. Eyes:     Extraocular Movements: Extraocular movements intact.     Conjunctiva/sclera: Conjunctivae normal.     Pupils: Pupils are equal, round, and reactive to light.     Comments: Raccoon eyes noted. No entrapment  Cardiovascular:     Rate and Rhythm: Normal rate.  Pulmonary:     Effort: Pulmonary effort is normal.  Musculoskeletal:        General: Normal range of motion.     Cervical back: Neck supple.     Comments: No TTP to the CTL spine  Skin:    General: Skin is warm and dry.  Neurological:     Mental Status: She is alert.     Comments: Mental Status:  Alert, thought content appropriate, able to give a coherent history. Speech fluent without evidence of aphasia. Able to follow 2 step commands without difficulty.  Cranial Nerves:  II:  pupils equal, round, reactive to light III,IV, VI: ptosis not present,  extra-ocular motions intact bilaterally  V,VII: smile symmetric, facial light touch sensation equal VIII: hearing grossly normal to voice  X: uvula elevates symmetrically  XI: bilateral shoulder shrug symmetric and strong XII: midline tongue extension without fassiculations Motor:  Normal tone. 5/5 strength of BUE and BLE major muscle groups including strong and equal grip strength and dorsiflexion/plantar flexion Sensory: light touch normal in all extremities.     ED Results / Procedures / Treatments   Labs (all labs ordered are listed, but  only abnormal results are displayed) Labs Reviewed - No data to display  EKG None  Radiology CT HEAD WO CONTRAST ( )  Result Date: 07/02/2021 CLINICAL DATA:  Status post facial trauma. EXAM: CT HEAD WITHOUT CONTRAST CT MAXILLOFACIAL WITHOUT CONTRAST CT CERVICAL SPINE WITHOUT CONTRAST TECHNIQUE: Multidetector CT imaging of the head, cervical spine, and maxillofacial structures were performed using the standard protocol without intravenous contrast. Multiplanar CT image reconstructions of the cervical spine and maxillofacial structures were also generated. COMPARISON:  None. FINDINGS: CT HEAD FINDINGS Brain: No evidence of acute infarction, hemorrhage, extra-axial collection, ventriculomegaly, or mass effect. Generalized cerebral atrophy. Periventricular white matter low attenuation likely secondary to microangiopathy. Vascular: Cerebrovascular atherosclerotic calcifications are noted. Skull: Negative for fracture or focal lesion. Sinuses/Orbits: Visualized portions of the orbits are unremarkable. Visualized portions of the paranasal sinuses are unremarkable. Visualized portions of the mastoid air cells are unremarkable. Comminuted fracture of the right and left nasal bone with overlying soft tissue laceration and soft tissue emphysema. Other: None. CT MAXILLOFACIAL FINDINGS Osseous: Comminuted fracture of the right and left nasal bone with overlying soft  tissue laceration and soft tissue emphysema. No other acute fracture. Temporomandibular joints are normal. Orbits: Negative. No traumatic or inflammatory finding. Sinuses: Paranasal sinuses are clear.  Mastoid sinuses are clear. Soft tissues: Soft tissue laceration and soft tissue emphysema overlying the nasal bone. No other soft tissue abnormality. CT CERVICAL SPINE FINDINGS Alignment: 3 mm anterolisthesis of C2 on C3 secondary to facet disease. 2 mm anterolisthesis of C3 on C4 secondary to facet disease. Loss of the normal cervical lordosis with straightening. Skull base and vertebrae: No acute fracture. No primary bone lesion or focal pathologic process. Soft tissues and spinal canal: No prevertebral fluid or swelling. No visible canal hematoma. Disc levels: Degenerative disease with disc height loss at C3-4, C4-5, C5-6, C6-7, C7-T1 and T1-2. Severe left facet arthropathy at C2-3. At C3-4 there is ankylosis across the left facet joints. At C4-5 there is uncovertebral degenerative changes and bilateral foraminal stenosis. At C5-6 there is a broad-based disc osteophyte complex, bilateral uncovertebral degenerative changes and bilateral foraminal narrowing. Upper chest: Lung apices are clear. Other: No fluid collection or hematoma. IMPRESSION: 1. No acute intracranial pathology. 2. Comminuted fracture of the right and left nasal bone with overlying soft tissue laceration and soft tissue emphysema. 3.  No acute osseous injury of the cervical spine. 4. Cervical spine spondylosis as described above. Electronically Signed   By: Elige Ko M.D.   On: 07/02/2021 13:06   CT Cervical Spine Wo Contrast  Result Date: 07/02/2021 CLINICAL DATA:  Status post facial trauma. EXAM: CT HEAD WITHOUT CONTRAST CT MAXILLOFACIAL WITHOUT CONTRAST CT CERVICAL SPINE WITHOUT CONTRAST TECHNIQUE: Multidetector CT imaging of the head, cervical spine, and maxillofacial structures were performed using the standard protocol without  intravenous contrast. Multiplanar CT image reconstructions of the cervical spine and maxillofacial structures were also generated. COMPARISON:  None. FINDINGS: CT HEAD FINDINGS Brain: No evidence of acute infarction, hemorrhage, extra-axial collection, ventriculomegaly, or mass effect. Generalized cerebral atrophy. Periventricular white matter low attenuation likely secondary to microangiopathy. Vascular: Cerebrovascular atherosclerotic calcifications are noted. Skull: Negative for fracture or focal lesion. Sinuses/Orbits: Visualized portions of the orbits are unremarkable. Visualized portions of the paranasal sinuses are unremarkable. Visualized portions of the mastoid air cells are unremarkable. Comminuted fracture of the right and left nasal bone with overlying soft tissue laceration and soft tissue emphysema. Other: None. CT MAXILLOFACIAL FINDINGS Osseous: Comminuted fracture of the right and left nasal  bone with overlying soft tissue laceration and soft tissue emphysema. No other acute fracture. Temporomandibular joints are normal. Orbits: Negative. No traumatic or inflammatory finding. Sinuses: Paranasal sinuses are clear.  Mastoid sinuses are clear. Soft tissues: Soft tissue laceration and soft tissue emphysema overlying the nasal bone. No other soft tissue abnormality. CT CERVICAL SPINE FINDINGS Alignment: 3 mm anterolisthesis of C2 on C3 secondary to facet disease. 2 mm anterolisthesis of C3 on C4 secondary to facet disease. Loss of the normal cervical lordosis with straightening. Skull base and vertebrae: No acute fracture. No primary bone lesion or focal pathologic process. Soft tissues and spinal canal: No prevertebral fluid or swelling. No visible canal hematoma. Disc levels: Degenerative disease with disc height loss at C3-4, C4-5, C5-6, C6-7, C7-T1 and T1-2. Severe left facet arthropathy at C2-3. At C3-4 there is ankylosis across the left facet joints. At C4-5 there is uncovertebral degenerative  changes and bilateral foraminal stenosis. At C5-6 there is a broad-based disc osteophyte complex, bilateral uncovertebral degenerative changes and bilateral foraminal narrowing. Upper chest: Lung apices are clear. Other: No fluid collection or hematoma. IMPRESSION: 1. No acute intracranial pathology. 2. Comminuted fracture of the right and left nasal bone with overlying soft tissue laceration and soft tissue emphysema. 3.  No acute osseous injury of the cervical spine. 4. Cervical spine spondylosis as described above. Electronically Signed   By: Elige Ko M.D.   On: 07/02/2021 13:06   CT Maxillofacial Wo Contrast  Result Date: 07/02/2021 CLINICAL DATA:  Status post facial trauma. EXAM: CT HEAD WITHOUT CONTRAST CT MAXILLOFACIAL WITHOUT CONTRAST CT CERVICAL SPINE WITHOUT CONTRAST TECHNIQUE: Multidetector CT imaging of the head, cervical spine, and maxillofacial structures were performed using the standard protocol without intravenous contrast. Multiplanar CT image reconstructions of the cervical spine and maxillofacial structures were also generated. COMPARISON:  None. FINDINGS: CT HEAD FINDINGS Brain: No evidence of acute infarction, hemorrhage, extra-axial collection, ventriculomegaly, or mass effect. Generalized cerebral atrophy. Periventricular white matter low attenuation likely secondary to microangiopathy. Vascular: Cerebrovascular atherosclerotic calcifications are noted. Skull: Negative for fracture or focal lesion. Sinuses/Orbits: Visualized portions of the orbits are unremarkable. Visualized portions of the paranasal sinuses are unremarkable. Visualized portions of the mastoid air cells are unremarkable. Comminuted fracture of the right and left nasal bone with overlying soft tissue laceration and soft tissue emphysema. Other: None. CT MAXILLOFACIAL FINDINGS Osseous: Comminuted fracture of the right and left nasal bone with overlying soft tissue laceration and soft tissue emphysema. No other acute  fracture. Temporomandibular joints are normal. Orbits: Negative. No traumatic or inflammatory finding. Sinuses: Paranasal sinuses are clear.  Mastoid sinuses are clear. Soft tissues: Soft tissue laceration and soft tissue emphysema overlying the nasal bone. No other soft tissue abnormality. CT CERVICAL SPINE FINDINGS Alignment: 3 mm anterolisthesis of C2 on C3 secondary to facet disease. 2 mm anterolisthesis of C3 on C4 secondary to facet disease. Loss of the normal cervical lordosis with straightening. Skull base and vertebrae: No acute fracture. No primary bone lesion or focal pathologic process. Soft tissues and spinal canal: No prevertebral fluid or swelling. No visible canal hematoma. Disc levels: Degenerative disease with disc height loss at C3-4, C4-5, C5-6, C6-7, C7-T1 and T1-2. Severe left facet arthropathy at C2-3. At C3-4 there is ankylosis across the left facet joints. At C4-5 there is uncovertebral degenerative changes and bilateral foraminal stenosis. At C5-6 there is a broad-based disc osteophyte complex, bilateral uncovertebral degenerative changes and bilateral foraminal narrowing. Upper chest: Lung apices are clear. Other: No  fluid collection or hematoma. IMPRESSION: 1. No acute intracranial pathology. 2. Comminuted fracture of the right and left nasal bone with overlying soft tissue laceration and soft tissue emphysema. 3.  No acute osseous injury of the cervical spine. 4. Cervical spine spondylosis as described above. Electronically Signed   By: Elige Ko M.D.   On: 07/02/2021 13:06    Procedures .Marland KitchenLaceration Repair  Date/Time: 07/02/2021 1:44 PM Performed by: Karrie Meres, PA-C Authorized by: Karrie Meres, PA-C   Consent:    Consent obtained:  Verbal   Consent given by:  Patient   Risks, benefits, and alternatives were discussed: yes     Risks discussed:  Infection, pain and need for additional repair   Alternatives discussed:  No treatment Universal protocol:     Procedure explained and questions answered to patient or proxy's satisfaction: yes     Patient identity confirmed:  Verbally with patient Anesthesia:    Anesthesia method:  None Laceration details:    Location:  Face   Face location:  Forehead   Length (cm):  1 Pre-procedure details:    Preparation:  Patient was prepped and draped in usual sterile fashion and imaging obtained to evaluate for foreign bodies Exploration:    Hemostasis achieved with:  Direct pressure   Wound exploration: wound explored through full range of motion and entire depth of wound visualized     Contaminated: no   Treatment:    Area cleansed with:  Shur-Clens and saline   Amount of cleaning:  Standard   Visualized foreign bodies/material removed: no     Debridement:  None   Undermining:  None Skin repair:    Repair method:  Tissue adhesive Approximation:    Approximation:  Close Repair type:    Repair type:  Simple Post-procedure details:    Dressing:  Open (no dressing)   Procedure completion:  Tolerated   Medications Ordered in ED Medications - No data to display  ED Course  I have reviewed the triage vital signs and the nursing notes.  Pertinent labs & imaging results that were available during my care of the patient were reviewed by me and considered in my medical decision making (see chart for details).    MDM Rules/Calculators/A&P                          75 year old female presents emergency department today after mechanical fall.  She sustained head trauma but did not have LOC.  Her neurologic exam is within normal limits.  She does have a laceration between the 2 eyebrows that was repaired with Dermabond.  CT scan of the head/cervical spine and maxillofacial bones was obtained and were generally unremarkable except for bilateral nasal bone fractures that are comminuted.  Patient has no nasal septal hematoma on exam.  She was given information regarding nasal bone fracture precautions and was  advised to follow-up with her ear nose and throat doctor she is currently established with.  Have advised on return precautions.  She voices understanding the plan and reasons to return.  Questions answered.  Patient stable for discharge.   Final Clinical Impression(s) / ED Diagnoses Final diagnoses:  Fall, initial encounter  Laceration of forehead, initial encounter  Closed fracture of nasal bone, initial encounter    Rx / DC Orders ED Discharge Orders     None        Karrie Meres, PA-C 07/02/21 1345    Marianna Fuss  S, MD 07/03/21 [phone]

## 2021-07-02 NOTE — Discharge Instructions (Addendum)
Please follow-up with your ear nose and throat doctor in regards to your nasal bone fracture.  Return to the emergency department for any new or worsening symptoms in the meantime.

## 2021-07-02 NOTE — ED Triage Notes (Signed)
Pt arrives to ED after having a large dog knock her down today hitting her face on concrete. Pt denies LOC, denies blood thinners. Pt has bleeding and swelling to nose and between both eyes.

## 2021-07-03 NOTE — ED Provider Notes (Signed)
I spoke to radiology about this patient's edited CT results.  I was not directly involved in this patient's care yesterday.  However I did reach out to the patient spoke to her directly on the phone.  Reviewed these image findings.  She denies to me that she has now, or that she had yesterday pain in her neck.  She denies any radiculopathy or weakness in her arms or legs.  He is unaware of any prior history of cervical fracture.  I explained to her that is not clear if this is a new fracture or an older fracture.  I provided her the phone number for [facility] neurosurgery, and advised that she call today to arrange a follow-up appointment.  They will determine whether further imaging is needed at that time.  I reported that if she begins having pain in her neck, numbness or weakness in the arms or legs, she needs to return to the emergency department.  She verbalized understanding.   Terald Sleeper, MD 07/03/21 [phone]

## 2021-09-11 ENCOUNTER — Other Ambulatory Visit: Payer: Self-pay

## 2021-09-12 ENCOUNTER — Ambulatory Visit (INDEPENDENT_AMBULATORY_CARE_PROVIDER_SITE_OTHER): Payer: Medicare Other | Admitting: Family

## 2021-09-12 ENCOUNTER — Encounter: Payer: Self-pay | Admitting: Family

## 2021-09-12 VITALS — BP 138/76 | HR 80 | Temp 97.8°F | Ht 60.0 in | Wt 215.2 lb

## 2021-09-12 DIAGNOSIS — Z1231 Encounter for screening mammogram for malignant neoplasm of breast: Secondary | ICD-10-CM | POA: Diagnosis not present

## 2021-09-12 DIAGNOSIS — Z13820 Encounter for screening for osteoporosis: Secondary | ICD-10-CM | POA: Diagnosis not present

## 2021-09-12 DIAGNOSIS — E559 Vitamin D deficiency, unspecified: Secondary | ICD-10-CM | POA: Diagnosis not present

## 2021-09-12 DIAGNOSIS — Z0000 Encounter for general adult medical examination without abnormal findings: Secondary | ICD-10-CM | POA: Diagnosis not present

## 2021-09-12 DIAGNOSIS — E2839 Other primary ovarian failure: Secondary | ICD-10-CM | POA: Diagnosis not present

## 2021-09-12 DIAGNOSIS — Z13220 Encounter for screening for lipoid disorders: Secondary | ICD-10-CM | POA: Diagnosis not present

## 2021-09-12 LAB — CBC WITH DIFFERENTIAL/PLATELET
Basophils Absolute: 0.1 10*3/uL (ref 0.0–0.1)
Basophils Relative: 1.2 % (ref 0.0–3.0)
Eosinophils Absolute: 0.2 10*3/uL (ref 0.0–0.7)
Eosinophils Relative: 3.6 % (ref 0.0–5.0)
HCT: 39.3 % (ref 36.0–46.0)
Hemoglobin: 12.6 g/dL (ref 12.0–15.0)
Lymphocytes Relative: 22.5 % (ref 12.0–46.0)
Lymphs Abs: 1.4 10*3/uL (ref 0.7–4.0)
MCHC: 32.2 g/dL (ref 30.0–36.0)
MCV: 87.9 fl (ref 78.0–100.0)
Monocytes Absolute: 0.7 10*3/uL (ref 0.1–1.0)
Monocytes Relative: 11.1 % (ref 3.0–12.0)
Neutro Abs: 3.7 10*3/uL (ref 1.4–7.7)
Neutrophils Relative %: 61.6 % (ref 43.0–77.0)
Platelets: 209 10*3/uL (ref 150.0–400.0)
RBC: 4.46 Mil/uL (ref 3.87–5.11)
RDW: 14.2 % (ref 11.5–15.5)
WBC: 6.1 10*3/uL (ref 4.0–10.5)

## 2021-09-12 LAB — COMPREHENSIVE METABOLIC PANEL
ALT: 13 U/L (ref 0–35)
AST: 21 U/L (ref 0–37)
Albumin: 4.1 g/dL (ref 3.5–5.2)
Alkaline Phosphatase: 79 U/L (ref 39–117)
BUN: 20 mg/dL (ref 6–23)
CO2: 29 mEq/L (ref 19–32)
Calcium: 9.5 mg/dL (ref 8.4–10.5)
Chloride: 105 mEq/L (ref 96–112)
Creatinine, Ser: 1.08 mg/dL (ref 0.40–1.20)
GFR: 49.43 mL/min — ABNORMAL LOW (ref >60.00)
Glucose, Bld: 89 mg/dL (ref 70–99)
Potassium: 4.5 mEq/L (ref 3.5–5.1)
Sodium: 141 mEq/L (ref 135–145)
Total Bilirubin: 0.5 mg/dL (ref 0.2–1.2)
Total Protein: 6.8 g/dL (ref 6.0–8.3)

## 2021-09-12 LAB — LIPID PANEL
Cholesterol: 140 mg/dL (ref 0–200)
HDL: 63.9 mg/dL (ref >39.00)
LDL Cholesterol: 56 mg/dL (ref 0–99)
NonHDL: 75.84
Total CHOL/HDL Ratio: 2
Triglycerides: 97 mg/dL (ref 0.0–149.0)
VLDL: 19.4 mg/dL (ref 0.0–40.0)

## 2021-09-12 LAB — TSH: TSH: 2.52 u[IU]/mL (ref 0.35–5.50)

## 2021-09-12 LAB — VITAMIN D 25 HYDROXY (VIT D DEFICIENCY, FRACTURES): VITD: 16.92 ng/mL — ABNORMAL LOW (ref 30.00–100.00)

## 2021-09-12 MED ORDER — TRIAMCINOLONE ACETONIDE 0.1 % EX CREA: 1.0000 "application " | TOPICAL_CREAM | Freq: Two times a day (BID) | CUTANEOUS | 0 refills | Status: DC

## 2021-09-12 NOTE — Progress Notes (Signed)
Heather Cox is a 75 y.o. female with the following history as recorded in EpicCare:  Patient Active Problem List   Diagnosis Date Noted   Hx of colonic polyps 02/11/2017   Osteoarthritis of left knee 11/30/2016   Degenerative arthritis of left knee 11/30/2016    Current Outpatient Medications  Medication Sig Dispense Refill   amoxicillin (AMOXIL) 500 MG capsule TAKE 4 CAPSULES BY MOUTH 1-2 HOURS PRIOR TO DENTAL WORK  0   Aspirin-Caffeine 400-32 MG TABS Take 2 tablets by mouth 2 (two) times daily as needed (for pain or headache).     atenolol (TENORMIN) 25 MG tablet Take 25 mg by mouth daily.     atorvastatin (LIPITOR) 10 MG tablet Take 10 mg by mouth every evening.      calcium carbonate (TUMS - DOSED IN MG ELEMENTAL CALCIUM) 500 MG chewable tablet Chew 1 tablet by mouth daily as needed for indigestion or heartburn.     ibuprofen (ADVIL,MOTRIN) 200 MG tablet Take 400 mg by mouth 2 (two) times daily as needed for headache or moderate pain.     triamcinolone cream (KENALOG) 0.1 % Apply 1 application topically 2 (two) times daily. 30 g 0   No current facility-administered medications for this visit.    Allergies: Shrimp [shellfish allergy]  Past Medical History:  Diagnosis Date   Arthritis    GERD (gastroesophageal reflux disease)    occasionally and will take Tums if needed   History of colon polyps    benign   Hyperlipidemia    takes Atorvastatin daily   Hypertension    takes Atenolol daily   Joint pain     Past Surgical History:  Procedure Laterality Date   ABDOMINAL HYSTERECTOMY  1994   CARDIAC CATHETERIZATION  2017   03/25/16 ([facility]): Normal coronaries (EF 60-65% by echo)   cataract surgery Bilateral    CESAREAN SECTION  1977   COLONOSCOPY     COLONOSCOPY N/A 06/10/2017   Procedure: COLONOSCOPY;  Surgeon: Rogene Houston, MD;  [facility];  Service: Endoscopy;  Laterality: N/A;  [place] Right 06/08/2016   Procedure: RIGHT TOTAL KNEE  ARTHROPLASTY WITH COMPUTER NAVIGATION;  Surgeon: Rod Can, MD;  Location: [place];  Service: Orthopedics;  Laterality: Right;  Request RNFA    TONSILLECTOMY     at age 7   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   Procedure: TOTAL KNEE ARTHROPLASTY with Navigation;  Surgeon: Rod Can, MD;  Location: [place];  Service: Orthopedics;  Laterality: Left;   WRIST SURGERY Left 1540   YAG LASER APPLICATION Left 0/86/7619   Procedure: YAG LASER APPLICATION;  Surgeon: Williams Che, MD;  [facility];  Service: Ophthalmology;  Laterality: Left;  rescheduled to 2/20    YAG LASER APPLICATION Right 50/93/2671   Procedure: YAG LASER APPLICATION;  Surgeon: Williams Che, MD;  [facility];  Service: Ophthalmology;  Laterality: Right;    No family history on file.  Social History   Tobacco Use   Smoking status: Never   Smokeless tobacco: Never  Substance Use Topics   Alcohol use: No    Subjective:   Presents for yearly CPE; no acute concerns today; overdue for mammogram; Would like flu shot- defers pneumonia and Shingrix; up to date on dental and vision exams;  Review of Systems  Constitutional:  Positive for weight loss.       Planned- down 5 pounds;   HENT: Negative.    Eyes:  Negative.   Respiratory: Negative.    Cardiovascular: Negative.   Gastrointestinal: Negative.   Genitourinary: Negative.   Musculoskeletal: Negative.   Skin: Negative.   Neurological: Negative.   Endo/Heme/Allergies: Negative.   Psychiatric/Behavioral: Negative.         Objective:  Vitals:   09/12/21 1032  BP: 138/76  Pulse: 80  Temp: 97.8 F (36.6 C)  TempSrc: Oral  SpO2: 98%  Weight: 215 lb 3.2 oz (97.6 kg)  Height: 5' (1.524 m)    General: Well developed, well nourished, in no acute distress  Skin : Warm and dry.  Head: Normocephalic and atraumatic  Eyes: Sclera and conjunctiva clear; pupils round and reactive to light; extraocular movements intact  Ears: External normal;  canals clear; tympanic membranes normal  Oropharynx: Pink, supple. No suspicious lesions  Neck: Supple without thyromegaly, adenopathy  Lungs: Respirations unlabored; clear to auscultation bilaterally without wheeze, rales, rhonchi  CVS exam: normal rate and regular rhythm.  Abdomen: Soft; nontender; nondistended; normoactive bowel sounds; no masses or hepatosplenomegaly  Musculoskeletal: No deformities; no active joint inflammation  Extremities: No edema, cyanosis, clubbing  Vessels: Symmetric bilaterally  Neurologic: Alert and oriented; speech intact; face symmetrical; moves all extremities well; CNII-XII intact without focal deficit   Assessment:  1. PE (physical exam), annual   2. Visit for screening mammogram   3. Osteoporosis screening   4. Ovarian failure   5. Lipid screening   6. Vitamin D deficiency     Plan:  Age appropriate preventive healthcare needs addressed; encouraged regular eye doctor and dental exams; encouraged regular exercise; will update labs and refills as needed today; follow-up to be determined;  This visit occurred during the SARS-CoV-2 public health emergency.  Safety protocols were in place, including screening questions prior to the visit, additional usage of staff PPE, and extensive cleaning of exam room while observing appropriate contact time as indicated for disinfecting solutions.    No follow-ups on file.  Orders Placed This Encounter  Procedures   MM Digital Screening    Standing Status:   Future    Standing Expiration Date:   09/12/2022    Order Specific Question:   Reason for Exam (SYMPTOM  OR DIAGNOSIS REQUIRED)    Answer:   screening mammogram    Order Specific Question:   Preferred imaging location?    Answer:   [facility]   DG Bone Density    Standing Status:   Future    Standing Expiration Date:   09/12/2022    Scheduling Instructions:     Please schedule with mammogram    Order Specific Question:   Reason for Exam (SYMPTOM   OR DIAGNOSIS REQUIRED)    Answer:   ovarian failure/ screening for osteoporosis    Order Specific Question:   Preferred imaging location?    Answer:   [facility]   CBC with Differential/Platelet   Comp Met (CMET)   Lipid panel   TSH   Vitamin D (25 hydroxy)    Requested Prescriptions   Signed Prescriptions Disp Refills   triamcinolone cream (KENALOG) 0.1 % 30 g 0    Sig: Apply 1 application topically 2 (two) times daily.

## 2021-09-15 ENCOUNTER — Telehealth: Payer: Self-pay | Admitting: Family

## 2021-09-15 ENCOUNTER — Other Ambulatory Visit: Payer: Self-pay | Admitting: Family

## 2021-09-15 MED ORDER — VITAMIN D (ERGOCALCIFEROL) 1.25 MG (50000 UNIT) PO CAPS: 50000.0000 [IU] | ORAL_CAPSULE | ORAL | 0 refills | Status: DC

## 2021-09-15 NOTE — Telephone Encounter (Signed)
I had accidentally closed her labs without sending a result note.  1) Vitamin D is quite low- will supplement x 12 weeks and then would like her to take daily Vitamin D 2000 IU daily for maintenance after this.   Otherwise, labs are stable; continue Atorvastatin and Atenolol;  Please let us know if she has questions or concerns.

## 2021-09-16 NOTE — Telephone Encounter (Signed)
I have called pt and she stated that she has gotten the message and she took her first vit d yesterday.

## 2021-09-18 ENCOUNTER — Other Ambulatory Visit (HOSPITAL_BASED_OUTPATIENT_CLINIC_OR_DEPARTMENT_OTHER): Payer: Self-pay | Admitting: Family

## 2021-09-18 DIAGNOSIS — Z13820 Encounter for screening for osteoporosis: Secondary | ICD-10-CM

## 2021-09-18 DIAGNOSIS — E2839 Other primary ovarian failure: Secondary | ICD-10-CM

## 2021-10-28 ENCOUNTER — Other Ambulatory Visit: Payer: Self-pay

## 2021-10-28 ENCOUNTER — Encounter (HOSPITAL_BASED_OUTPATIENT_CLINIC_OR_DEPARTMENT_OTHER): Payer: Self-pay

## 2021-10-28 ENCOUNTER — Ambulatory Visit (HOSPITAL_BASED_OUTPATIENT_CLINIC_OR_DEPARTMENT_OTHER)
Admission: RE | Admit: 2021-10-28 | Discharge: 2021-10-28 | Disposition: A | Discharge status: Home / Self Care | Payer: Medicare Other | Source: Ambulatory Visit | Attending: Family | Admitting: Family

## 2021-10-28 DIAGNOSIS — E2839 Other primary ovarian failure: Secondary | ICD-10-CM | POA: Diagnosis present

## 2021-10-28 DIAGNOSIS — Z13820 Encounter for screening for osteoporosis: Secondary | ICD-10-CM | POA: Insufficient documentation

## 2021-10-28 DIAGNOSIS — Z1231 Encounter for screening mammogram for malignant neoplasm of breast: Secondary | ICD-10-CM

## 2022-01-01 DIAGNOSIS — Z961 Presence of intraocular lens: Secondary | ICD-10-CM | POA: Diagnosis not present

## 2022-01-01 DIAGNOSIS — H35372 Puckering of macula, left eye: Secondary | ICD-10-CM | POA: Diagnosis not present

## 2022-01-05 ENCOUNTER — Other Ambulatory Visit: Payer: Self-pay | Admitting: Family

## 2022-01-06 ENCOUNTER — Other Ambulatory Visit: Payer: Self-pay | Admitting: Family

## 2022-01-06 DIAGNOSIS — E559 Vitamin D deficiency, unspecified: Secondary | ICD-10-CM

## 2022-01-06 NOTE — Telephone Encounter (Signed)
I have called the pt and relayed the message from the provider. Pt stated understanding and she is scheduled for a lab visit for tomorrow and lab orders placed.

## 2022-01-06 NOTE — Telephone Encounter (Signed)
Would you like Pt to continue Ergocalciferol?  

## 2022-01-06 NOTE — Telephone Encounter (Signed)
FYI to provider.   I did tell pt that once we get the results that will determine if she needs the once a week vit D or if she should start the 2000iu of Vit D OTC.

## 2022-01-07 ENCOUNTER — Other Ambulatory Visit (INDEPENDENT_AMBULATORY_CARE_PROVIDER_SITE_OTHER): Payer: Medicare Other

## 2022-01-07 DIAGNOSIS — E559 Vitamin D deficiency, unspecified: Secondary | ICD-10-CM | POA: Diagnosis not present

## 2022-01-07 LAB — VITAMIN D 25 HYDROXY (VIT D DEFICIENCY, FRACTURES): VITD: 29.04 ng/mL — ABNORMAL LOW (ref 30.00–100.00)

## 2022-01-08 NOTE — Telephone Encounter (Signed)
I have called the pt and relayed the message from the provider. Pt stated understanding.  ?

## 2022-02-24 ENCOUNTER — Ambulatory Visit (INDEPENDENT_AMBULATORY_CARE_PROVIDER_SITE_OTHER): Payer: Medicare Other

## 2022-02-24 DIAGNOSIS — Z0000 Encounter for general adult medical examination without abnormal findings: Secondary | ICD-10-CM | POA: Diagnosis not present

## 2022-02-24 NOTE — Patient Instructions (Addendum)
Heather Cox , ?Thank you for taking time to come for your Medicare Wellness Visit. I appreciate your ongoing commitment to your health goals. Please review the following plan we discussed and let me know if I can assist you in the future.  ? ?Screening recommendations/referrals: ?Colonoscopy: no longer required  ?Mammogram: Done 10/28/21 repeat every year  ?Bone Density: Done 10/28/21 repeat every 2 years  ?Recommended yearly ophthalmology/optometry visit for glaucoma screening and checkup ?Recommended yearly dental visit for hygiene and checkup ? ?Vaccinations: ?Influenza vaccine: pt stated Up to date ?Pneumococcal vaccine: postponed  ?Tdap vaccine: Done 04/04/21 repeat every 10 years  ?Shingles vaccine: Shingrix discussed. Please contact your pharmacy for coverage information.    ?Covid-19:Completed 02/22/20, 05/15/21 ? ?Advanced directives: Please bring a copy of your health care power of attorney and living will to the office at your convenience. ? ?Conditions/risks identified: None at this time  ? ?Next appointment: Follow up in one year for your annual wellness visit  ? ? ?Preventive Care 78 Years and Older, Female ?Preventive care refers to lifestyle choices and visits with your health care provider that can promote health and wellness. ?What does preventive care include? ?A yearly physical exam. This is also called an annual well check. ?Dental exams once or twice a year. ?Routine eye exams. Ask your health care provider how often you should have your eyes checked. ?Personal lifestyle choices, including: ?Daily care of your teeth and gums. ?Regular physical activity. ?Eating a healthy diet. ?Avoiding tobacco and drug use. ?Limiting alcohol use. ?Practicing safe sex. ?Taking low-dose aspirin every day. ?Taking vitamin and mineral supplements as recommended by your health care provider. ?What happens during an annual well check? ?The services and screenings done by your health care provider during your annual well  check will depend on your age, overall health, lifestyle risk factors, and family history of disease. ?Counseling  ?Your health care provider may ask you questions about your: ?Alcohol use. ?Tobacco use. ?Drug use. ?Emotional well-being. ?Home and relationship well-being. ?Sexual activity. ?Eating habits. ?History of falls. ?Memory and ability to understand (cognition). ?Work and work Statistician. ?Reproductive health. ?Screening  ?You may have the following tests or measurements: ?Height, weight, and BMI. ?Blood pressure. ?Lipid and cholesterol levels. These may be checked every 5 years, or more frequently if you are over 84 years old. ?Skin check. ?Lung cancer screening. You may have this screening every year starting at age 20 if you have a 30-pack-year history of smoking and currently smoke or have quit within the past 15 years. ?Fecal occult blood test (FOBT) of the stool. You may have this test every year starting at age 5. ?Flexible sigmoidoscopy or colonoscopy. You may have a sigmoidoscopy every 5 years or a colonoscopy every 10 years starting at age 24. ?Hepatitis C blood test. ?Hepatitis B blood test. ?Sexually transmitted disease (STD) testing. ?Diabetes screening. This is done by checking your blood sugar (glucose) after you have not eaten for a while (fasting). You may have this done every 1-3 years. ?Bone density scan. This is done to screen for osteoporosis. You may have this done starting at age 47. ?Mammogram. This may be done every 1-2 years. Talk to your health care provider about how often you should have regular mammograms. ?Talk with your health care provider about your test results, treatment options, and if necessary, the need for more tests. ?Vaccines  ?Your health care provider may recommend certain vaccines, such as: ?Influenza vaccine. This is recommended every year. ?Tetanus, diphtheria,  and acellular pertussis (Tdap, Td) vaccine. You may need a Td booster every 10 years. ?Zoster  vaccine. You may need this after age 22. ?Pneumococcal 13-valent conjugate (PCV13) vaccine. One dose is recommended after age 90. ?Pneumococcal polysaccharide (PPSV23) vaccine. One dose is recommended after age 89. ?Talk to your health care provider about which screenings and vaccines you need and how often you need them. ?This information is not intended to replace advice given to you by your health care provider. Make sure you discuss any questions you have with your health care provider. ?Document Released: 11/22/2015 Document Revised: 07/15/2016 Document Reviewed: 08/27/2015 ?Elsevier Interactive Patient Education ? 2017 [place]. ? ?Fall Prevention in the Home ?Falls can cause injuries. They can happen to people of all ages. There are many things you can do to make your home safe and to help prevent falls. ?What can I do on the outside of my home? ?Regularly fix the edges of walkways and driveways and fix any cracks. ?Remove anything that might make you trip as you walk through a door, such as a raised step or threshold. ?Trim any bushes or trees on the path to your home. ?Use bright outdoor lighting. ?Clear any walking paths of anything that might make someone trip, such as rocks or tools. ?Regularly check to see if handrails are loose or broken. Make sure that both sides of any steps have handrails. ?Any raised decks and porches should have guardrails on the edges. ?Have any leaves, snow, or ice cleared regularly. ?Use sand or salt on walking paths during winter. ?Clean up any spills in your garage right away. This includes oil or grease spills. ?What can I do in the bathroom? ?Use night lights. ?Install grab bars by the toilet and in the tub and shower. Do not use towel bars as grab bars. ?Use non-skid mats or decals in the tub or shower. ?If you need to sit down in the shower, use a plastic, non-slip stool. ?Keep the floor dry. Clean up any water that spills on the floor as soon as it happens. ?Remove  soap buildup in the tub or shower regularly. ?Attach bath mats securely with double-sided non-slip rug tape. ?Do not have throw rugs and other things on the floor that can make you trip. ?What can I do in the bedroom? ?Use night lights. ?Make sure that you have a light by your bed that is easy to reach. ?Do not use any sheets or blankets that are too big for your bed. They should not hang down onto the floor. ?Have a firm chair that has side arms. You can use this for support while you get dressed. ?Do not have throw rugs and other things on the floor that can make you trip. ?What can I do in the kitchen? ?Clean up any spills right away. ?Avoid walking on wet floors. ?Keep items that you use a lot in easy-to-reach places. ?If you need to reach something above you, use a strong step stool that has a grab bar. ?Keep electrical cords out of the way. ?Do not use floor polish or wax that makes floors slippery. If you must use wax, use non-skid floor wax. ?Do not have throw rugs and other things on the floor that can make you trip. ?What can I do with my stairs? ?Do not leave any items on the stairs. ?Make sure that there are handrails on both sides of the stairs and use them. Fix handrails that are broken or loose. Make sure  that handrails are as long as the stairways. ?Check any carpeting to make sure that it is firmly attached to the stairs. Fix any carpet that is loose or worn. ?Avoid having throw rugs at the top or bottom of the stairs. If you do have throw rugs, attach them to the floor with carpet tape. ?Make sure that you have a light switch at the top of the stairs and the bottom of the stairs. If you do not have them, ask someone to add them for you. ?What else can I do to help prevent falls? ?Wear shoes that: ?Do not have high heels. ?Have rubber bottoms. ?Are comfortable and fit you well. ?Are closed at the toe. Do not wear sandals. ?If you use a stepladder: ?Make sure that it is fully opened. Do not climb a  closed stepladder. ?Make sure that both sides of the stepladder are locked into place. ?Ask someone to hold it for you, if possible. ?Clearly mark and make sure that you can see: ?Any grab bars or handrails. ?

## 2022-02-24 NOTE — Progress Notes (Addendum)
Virtual Visit via Telephone Note ? ?I connected with  Heather Cox on 02/24/22 at  2:30 PM EDT by telephone and verified that I am speaking with the correct person using two identifiers. ? ?Medicare Annual Wellness visit completed telephonically due to Covid-19 pandemic.  ? ?Persons participating in this call: This Health Coach and this patient.  ? ?Location: ?Patient: Home ?Provider: Office ?  ?I discussed the limitations, risks, security and privacy concerns of performing an evaluation and management service by telephone and the availability of in person appointments. The patient expressed understanding and agreed to proceed. ? ?Unable to perform video visit due to video visit attempted and failed and/or patient does not have video capability.  ? ?Some vital signs may be absent or patient reported.  ? ?Heather Schlein, Heather Cox ? ? ?Subjective:  ? Heather Cox is a 76 y.o. female who presents for an Initial Medicare Annual Wellness Visit. ? ?Review of Systems    ? ?Cardiac Risk Factors include: advanced age (>40men, >35 women);obesity (BMI >30kg/m2) ? ?   ?Objective:  ?  ?There were no vitals filed for this visit. ?There is no height or weight on file to calculate BMI. ? ? ?  02/24/2022  ?  2:36 PM 07/02/2021  ? 11:45 AM 12/13/2020  ? 10:42 AM 06/10/2017  ?  7:19 AM 11/19/2016  ?  2:24 PM 05/28/2016  ?  2:15 PM  ?Advanced Directives  ?Does Patient Have a Medical Advance Directive? Yes No Yes Yes Yes Yes  ?Type of Estate agent of [place];Living will  Healthcare Power of [place];Living will Healthcare Power of [place];Living will Healthcare Power of [employer] Power of Attorney  ?Does patient want to make changes to medical advance directive?     No - Patient declined   ?Copy of Healthcare Power of Attorney in Chart? No - copy requested   No - copy requested Yes No - copy requested  ?Would patient like information on creating a medical advance directive?  No - Patient declined No -  Patient declined     ? ? ?Current Medications (verified) ?Outpatient Encounter Medications as of 02/24/2022  ?Medication Sig  ? amoxicillin (AMOXIL) 500 MG capsule TAKE 4 CAPSULES BY MOUTH 1-2 HOURS PRIOR TO DENTAL WORK  ? Aspirin-Caffeine 400-32 MG TABS Take 2 tablets by mouth 2 (two) times daily as needed (for pain or headache).  ? atenolol (TENORMIN) 25 MG tablet Take 25 mg by mouth daily.  ? atorvastatin (LIPITOR) 10 MG tablet Take 10 mg by mouth every evening.   ? calcium carbonate (TUMS - DOSED IN MG ELEMENTAL CALCIUM) 500 MG chewable tablet Chew 1 tablet by mouth daily as needed for indigestion or heartburn.  ? ibuprofen (ADVIL,MOTRIN) 200 MG tablet Take 400 mg by mouth 2 (two) times daily as needed for headache or moderate pain.  ? Vitamin D, Ergocalciferol, (DRISDOL) 1.25 MG (50000 UNIT) CAPS capsule TAKE 1 CAPSULE BY MOUTH EVERY 7 DAYS FOR 12 DOSES  ? [DISCONTINUED] triamcinolone cream (KENALOG) 0.1 % Apply 1 application topically 2 (two) times daily.  ? ?No facility-administered encounter medications on file as of 02/24/2022.  ? ? ?Allergies (verified) ?Shellfish allergy and Shrimp (diagnostic)  ? ?History: ?Past Medical History:  ?Diagnosis Date  ? Arthritis   ? GERD (gastroesophageal reflux disease)   ? occasionally and will take Tums if needed  ? History of colon polyps   ? benign  ? Hyperlipidemia   ? takes Atorvastatin daily  ?  Hypertension   ? takes Atenolol daily  ? Joint pain   ? ?Past Surgical History:  ?Procedure Laterality Date  ? ABDOMINAL HYSTERECTOMY  1994  ? CARDIAC CATHETERIZATION  2017  ? 03/25/16 ([facility]): Normal coronaries (EF 60-65% by echo)  ? cataract surgery Bilateral   ? CESAREAN SECTION  1977  ? COLONOSCOPY    ? COLONOSCOPY N/A 06/10/2017  ? Procedure: COLONOSCOPY;  Surgeon: Malissa Hippo, MD;  [facility];  Service: Endoscopy;  Laterality: N/A;  830  ? KNEE ARTHROPLASTY Right 06/08/2016  ? Procedure: RIGHT TOTAL KNEE ARTHROPLASTY WITH COMPUTER NAVIGATION;  Surgeon: Samson Frederic, MD;  [facility];  Service: Orthopedics;  Laterality: Right;  Request RNFA ?  ? TONSILLECTOMY    ? at age 28  ? 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  ? Procedure: TOTAL KNEE ARTHROPLASTY with Navigation;  Surgeon: Samson Frederic, MD;  Location: [facility];  Service: Orthopedics;  Laterality: Left;  ? WRIST SURGERY Left 1988  ? YAG LASER APPLICATION Left 12/30/2015  ? Procedure: YAG LASER APPLICATION;  Surgeon: Susa Simmonds, MD;  [facility];  Service: Ophthalmology;  Laterality: Left;  rescheduled to 2/20   ? YAG LASER APPLICATION Right 09/04/2016  ? Procedure: YAG LASER APPLICATION;  Surgeon: Susa Simmonds, MD;  [facility]: [facility];  Service: Ophthalmology;  Laterality: Right;  ? ?History reviewed. No pertinent family history. ?Social History  ? ?Socioeconomic History  ? Marital status: Widowed  ?  Spouse name: Not on file  ? Number of children: Not on file  ? Years of education: Not on file  ? Highest education level: Not on file  ?Occupational History  ? Not on file  ?Tobacco Use  ? Smoking status: Never  ? Smokeless tobacco: Never  ?Substance and Sexual Activity  ? Alcohol use: No  ? Drug use: No  ? Sexual activity: Not on file  ?Other Topics Concern  ? Not on file  ?Social History Narrative  ? Not on file  ? ?Social Determinants of Health  ? ?Financial Resource Strain: Low Risk   ? Difficulty of Paying Living Expenses: Not hard at all  ?Food Insecurity: No Food Insecurity  ? Worried About Programme researcher, broadcasting/film/video in the Last Year: Never true  ? Ran Out of Food in the Last Year: Never true  ?Transportation Needs: No Transportation Needs  ? Lack of Transportation (Medical): No  ? Lack of Transportation (Non-Medical): No  ?Physical Activity: Inactive  ? Days of Exercise per Week: 0 days  ? Minutes of Exercise per Session: 0 min  ?Stress: No Stress Concern Present  ? Feeling of Stress : Not at all  ?Social Connections: Moderately Integrated  ? Frequency of Communication with Friends and Family: Twice  a week  ? Frequency of Social Gatherings with Friends and Family: More than three times a week  ? Attends Religious Services: More than 4 times per year  ? Active Member of Clubs or Organizations: Yes  ? Attends Banker Meetings: 1 to 4 times per year  ? Marital Status: Widowed  ? ? ?Tobacco Counseling ?Counseling given: Not Answered ? ? ?Clinical Intake: ? ?Pre-visit preparation completed: Yes ? ?Pain : No/denies pain ? ?  ? ?BMI - recorded: 42.03 ?Nutritional Status: BMI > 30  Obese ?Nutritional Risks: None ?Diabetes: No ? ?How often do you need to have someone help you when you read instructions, pamphlets, or other written materials from your doctor or pharmacy?: 1 -  Never ? ?Diabetic?no ? ?Interpreter Needed?: No ? ?Information entered by :: Lanier Ensignina Tomoya Ringwald, Heather Cox ? ? ?Activities of Daily Living ? ?  02/24/2022  ?  2:38 PM  ?In your present state of health, do you have any difficulty performing the following activities:  ?Hearing? 1  ?Vision? 0  ?Difficulty concentrating or making decisions? 0  ?Walking or climbing stairs? 0  ?Dressing or bathing? 0  ?Doing errands, shopping? 0  ?Preparing Food and eating ? N  ?Using the Toilet? N  ?In the past six months, have you accidently leaked urine? N  ?Do you have problems with loss of bowel control? N  ?Managing your Medications? N  ?Managing your Finances? N  ?Housekeeping or managing your Housekeeping? N  ? ? ?Patient Care Team: ?Heather Cox, Heather Woodruff, Heather Cox as PCP - General (Internal Medicine) ? ?Indicate any recent Medical Services you may have received from other than [facility] providers in the past year (date may be approximate). ? ?   ?Assessment:  ? This is a routine wellness examination for Heather Hashimotoatricia. ? ?Hearing/Vision screen ?Hearing Screening - Comments:: Pt wears hearing aids  ?Vision Screening - Comments:: Pt follows up with Dr Clois DupesAlan Atkins for annual eye exams  ? ?Dietary issues and exercise activities discussed: ?Current Exercise Habits: The patient  does not participate in regular exercise at present ? ? Goals Addressed   ? ?  ?  ?  ?  ? This Visit's Progress  ?  Patient Stated     ?  None at this time  ?  ? ?  ? ?Depression Screen ? ?  02/24/2022  ?  2

## 2022-05-28 ENCOUNTER — Telehealth: Payer: Self-pay | Admitting: Family

## 2022-05-28 MED ORDER — ATENOLOL 25 MG PO TABS: 25.0000 mg | ORAL_TABLET | Freq: Every day | ORAL | 1 refills | Status: DC

## 2022-05-28 NOTE — Telephone Encounter (Signed)
Refill sent.

## 2022-05-28 NOTE — Telephone Encounter (Signed)
Medication:   atenolol (TENORMIN) 25 MG tablet [[id number]]   Has the patient contacted their pharmacy? No. (If no, request that the patient contact the pharmacy for the refill.) (If yes, when and what did the pharmacy advise?)  Preferred Pharmacy (with phone number or street name):   [id number] - [place], [place] - [address] AT [facility] [address]  [address], [place] [address]  Phone:  [phone]  Fax:  [phone]   Agent: Please be advised that RX refills may take up to 3 business days. We ask that you follow-up with your pharmacy.

## 2022-06-30 DIAGNOSIS — H43813 Vitreous degeneration, bilateral: Secondary | ICD-10-CM | POA: Diagnosis not present

## 2022-06-30 DIAGNOSIS — H43392 Other vitreous opacities, left eye: Secondary | ICD-10-CM | POA: Diagnosis not present

## 2022-06-30 DIAGNOSIS — H35372 Puckering of macula, left eye: Secondary | ICD-10-CM | POA: Diagnosis not present

## 2022-09-17 ENCOUNTER — Encounter: Payer: Self-pay | Admitting: Family

## 2022-09-17 ENCOUNTER — Ambulatory Visit (INDEPENDENT_AMBULATORY_CARE_PROVIDER_SITE_OTHER): Payer: Medicare Other | Admitting: Family

## 2022-09-17 VITALS — BP 130/78 | HR 63 | Temp 97.8°F | Resp 18 | Ht 60.0 in | Wt 197.4 lb

## 2022-09-17 DIAGNOSIS — Z23 Encounter for immunization: Secondary | ICD-10-CM | POA: Diagnosis not present

## 2022-09-17 DIAGNOSIS — Z13220 Encounter for screening for lipoid disorders: Secondary | ICD-10-CM | POA: Diagnosis not present

## 2022-09-17 DIAGNOSIS — E559 Vitamin D deficiency, unspecified: Secondary | ICD-10-CM | POA: Diagnosis not present

## 2022-09-17 DIAGNOSIS — Z0001 Encounter for general adult medical examination with abnormal findings: Secondary | ICD-10-CM

## 2022-09-17 DIAGNOSIS — Z0000 Encounter for general adult medical examination without abnormal findings: Secondary | ICD-10-CM

## 2022-09-17 LAB — COMPREHENSIVE METABOLIC PANEL
ALT: 11 U/L (ref 0–35)
AST: 20 U/L (ref 0–37)
Albumin: 4.2 g/dL (ref 3.5–5.2)
Alkaline Phosphatase: 72 U/L (ref 39–117)
BUN: 23 mg/dL (ref 6–23)
CO2: 30 mEq/L (ref 19–32)
Calcium: 9.5 mg/dL (ref 8.4–10.5)
Chloride: 104 mEq/L (ref 96–112)
Creatinine, Ser: 1.12 mg/dL (ref 0.40–1.20)
GFR: 46.98 mL/min — ABNORMAL LOW (ref >60.00)
Glucose, Bld: 87 mg/dL (ref 70–99)
Potassium: 5.1 mEq/L (ref 3.5–5.1)
Sodium: 139 mEq/L (ref 135–145)
Total Bilirubin: 0.5 mg/dL (ref 0.2–1.2)
Total Protein: 6.6 g/dL (ref 6.0–8.3)

## 2022-09-17 LAB — CBC WITH DIFFERENTIAL/PLATELET
Basophils Absolute: 0 10*3/uL (ref 0.0–0.1)
Basophils Relative: 0.8 % (ref 0.0–3.0)
Eosinophils Absolute: 0.1 10*3/uL (ref 0.0–0.7)
Eosinophils Relative: 1.9 % (ref 0.0–5.0)
HCT: 39.4 % (ref 36.0–46.0)
Hemoglobin: 12.8 g/dL (ref 12.0–15.0)
Lymphocytes Relative: 22.9 % (ref 12.0–46.0)
Lymphs Abs: 1.4 10*3/uL (ref 0.7–4.0)
MCHC: 32.6 g/dL (ref 30.0–36.0)
MCV: 89 fl (ref 78.0–100.0)
Monocytes Absolute: 0.7 10*3/uL (ref 0.1–1.0)
Monocytes Relative: 11 % (ref 3.0–12.0)
Neutro Abs: 3.9 10*3/uL (ref 1.4–7.7)
Neutrophils Relative %: 63.4 % (ref 43.0–77.0)
Platelets: 194 10*3/uL (ref 150.0–400.0)
RBC: 4.43 Mil/uL (ref 3.87–5.11)
RDW: 13.6 % (ref 11.5–15.5)
WBC: 6.2 10*3/uL (ref 4.0–10.5)

## 2022-09-17 LAB — VITAMIN D 25 HYDROXY (VIT D DEFICIENCY, FRACTURES): VITD: 26.8 ng/mL — ABNORMAL LOW (ref 30.00–100.00)

## 2022-09-17 LAB — TSH: TSH: 1.75 u[IU]/mL (ref 0.35–5.50)

## 2022-09-17 LAB — LIPID PANEL
Cholesterol: 142 mg/dL (ref 0–200)
HDL: 66.8 mg/dL (ref >39.00)
LDL Cholesterol: 54 mg/dL (ref 0–99)
NonHDL: 75.22
Total CHOL/HDL Ratio: 2
Triglycerides: 108 mg/dL (ref 0.0–149.0)
VLDL: 21.6 mg/dL (ref 0.0–40.0)

## 2022-09-17 MED ORDER — ATORVASTATIN CALCIUM 10 MG PO TABS: 10.0000 mg | ORAL_TABLET | Freq: Every evening | ORAL | 3 refills | Status: DC

## 2022-09-17 MED ORDER — ATENOLOL 25 MG PO TABS: 25.0000 mg | ORAL_TABLET | Freq: Every day | ORAL | 3 refills | Status: DC

## 2022-09-17 NOTE — Progress Notes (Signed)
Heather Cox is a 76 y.o. female with the following history as recorded in EpicCare:  Patient Active Problem List   Diagnosis Date Noted   Hx of colonic polyps 02/11/2017   Osteoarthritis of left knee 11/30/2016   Degenerative arthritis of left knee 11/30/2016    Current Outpatient Medications  Medication Sig Dispense Refill   amoxicillin (AMOXIL) 500 MG capsule TAKE 4 CAPSULES BY MOUTH 1-2 HOURS PRIOR TO DENTAL WORK  0   Aspirin-Caffeine 400-32 MG TABS Take 2 tablets by mouth 2 (two) times daily as needed (for pain or headache).     calcium carbonate (TUMS - DOSED IN MG ELEMENTAL CALCIUM) 500 MG chewable tablet Chew 1 tablet by mouth daily as needed for indigestion or heartburn.     Cholecalciferol (VITAMIN D) 50 MCG (2000 UT) CAPS Take by mouth.     ibuprofen (ADVIL,MOTRIN) 200 MG tablet Take 400 mg by mouth 2 (two) times daily as needed for headache or moderate pain.     atenolol (TENORMIN) 25 MG tablet Take 1 tablet (25 mg total) by mouth daily. 90 tablet 3   atorvastatin (LIPITOR) 10 MG tablet Take 1 tablet (10 mg total) by mouth every evening. 90 tablet 3   No current facility-administered medications for this visit.    Allergies: Shellfish allergy and Shrimp (diagnostic)  Past Medical History:  Diagnosis Date   Arthritis    GERD (gastroesophageal reflux disease)    occasionally and will take Tums if needed   History of colon polyps    benign   Hyperlipidemia    takes Atorvastatin daily   Hypertension    takes Atenolol daily   Joint pain     Past Surgical History:  Procedure Laterality Date   ABDOMINAL HYSTERECTOMY  1994   CARDIAC CATHETERIZATION  2017   03/25/16 ([facility]): Normal coronaries (EF 60-65% by echo)   cataract surgery Bilateral    CESAREAN SECTION  1977   COLONOSCOPY     COLONOSCOPY N/A 06/10/2017   Procedure: COLONOSCOPY;  Surgeon: Rogene Houston, MD;  [facility];  Service: Endoscopy;  Laterality: N/A;  [place] Right  06/08/2016   Procedure: RIGHT TOTAL KNEE ARTHROPLASTY WITH COMPUTER NAVIGATION;  Surgeon: Rod Can, MD;  Location: [place];  Service: Orthopedics;  Laterality: Right;  Request RNFA    TONSILLECTOMY     at age 68   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  Social History   Tobacco Use   Smoking status: Never   Smokeless tobacco: Never  Substance Use Topics   Alcohol use: No    Subjective:   Presents for yearly CPE; has lost 18 pounds since last OV- working on better eating habits; planning to see her orthopedist about bilateral hip/ back pain;  Would like flu shot- defers other vaccines;  Needs refills on medications today;   Review of Systems  Constitutional: Negative.  HENT: Negative.    Eyes: Negative.   Respiratory: Negative.    Cardiovascular: Negative.   Gastrointestinal: Negative.   Genitourinary: Negative.   Musculoskeletal:  Positive for joint pain.  Skin: Negative.   Neurological: Negative.   Endo/Heme/Allergies: Negative.   Psychiatric/Behavioral: Negative.       Objective:  Vitals:   09/17/22 1030 09/17/22 1058  BP: (!) 149/76 130/78  Pulse: 63   Resp: 18   Temp: 97.8 F (36.6 C)   TempSrc: Oral   SpO2: 98%   Weight: 197 lb 6.4 oz (89.5 kg)   Height: 5' (1.524 m)     General: Well developed, well nourished, in no acute distress  Skin : Warm and dry.  Head: Normocephalic and atraumatic   Eyes: Sclera and conjunctiva clear; pupils round and reactive to light; extraocular movements intact  Ears: External normal; canals clear; tympanic membranes normal  Oropharynx: Pink, supple. No suspicious lesions  Neck: Supple without thyromegaly, adenopathy  Lungs: Respirations unlabored; clear to auscultation bilaterally without wheeze, rales, rhonchi  CVS exam: normal rate and regular rhythm.  Abdomen: Soft; nontender; nondistended; normoactive bowel sounds; no masses or hepatosplenomegaly  Musculoskeletal: No deformities; no active joint inflammation  Extremities: No edema, cyanosis, clubbing  Vessels: Symmetric bilaterally  Neurologic: Alert and oriented; speech intact; face symmetrical; moves all extremities well; CNII-XII intact without focal deficit   Assessment:  1. PE (physical exam), annual   2. Lipid screening   3. Vitamin D deficiency   4. Need for immunization against influenza     Plan:  Age appropriate preventive healthcare needs addressed; encouraged regular eye doctor and dental exams; encouraged regular exercise and continued work on weight loss; will update labs and refills as needed today; follow-up in 1 year, sooner prn.    No follow-ups on file.  Orders Placed This Encounter  Procedures   Flu Vaccine QUAD High Dose(Fluad)   CBC with Differential/Platelet   Comp Met (CMET)   Lipid panel   TSH   Vitamin D (25 hydroxy)    Requested Prescriptions   Signed Prescriptions Disp Refills   atorvastatin (LIPITOR) 10 MG tablet 90 tablet 3    Sig: Take 1 tablet (10 mg total) by mouth every evening.   atenolol (TENORMIN) 25 MG tablet 90 tablet 3    Sig: Take 1 tablet (25 mg total) by mouth daily.

## 2022-10-19 DIAGNOSIS — M7061 Trochanteric bursitis, right hip: Secondary | ICD-10-CM | POA: Diagnosis not present

## 2022-10-19 DIAGNOSIS — M7062 Trochanteric bursitis, left hip: Secondary | ICD-10-CM | POA: Diagnosis not present

## 2022-10-19 DIAGNOSIS — M1612 Unilateral primary osteoarthritis, left hip: Secondary | ICD-10-CM | POA: Diagnosis not present

## 2022-10-28 DIAGNOSIS — M25552 Pain in left hip: Secondary | ICD-10-CM | POA: Diagnosis not present

## 2022-11-03 DIAGNOSIS — M25552 Pain in left hip: Secondary | ICD-10-CM | POA: Diagnosis not present

## 2022-11-03 DIAGNOSIS — M25551 Pain in right hip: Secondary | ICD-10-CM | POA: Diagnosis not present

## 2022-11-06 DIAGNOSIS — M25552 Pain in left hip: Secondary | ICD-10-CM | POA: Diagnosis not present

## 2022-11-06 DIAGNOSIS — M25551 Pain in right hip: Secondary | ICD-10-CM | POA: Diagnosis not present

## 2022-11-11 DIAGNOSIS — M25551 Pain in right hip: Secondary | ICD-10-CM | POA: Diagnosis not present

## 2022-11-11 DIAGNOSIS — M25552 Pain in left hip: Secondary | ICD-10-CM | POA: Diagnosis not present

## 2022-11-18 DIAGNOSIS — M25552 Pain in left hip: Secondary | ICD-10-CM | POA: Diagnosis not present

## 2022-11-18 DIAGNOSIS — M25551 Pain in right hip: Secondary | ICD-10-CM | POA: Diagnosis not present

## 2022-11-20 ENCOUNTER — Other Ambulatory Visit: Payer: Self-pay | Admitting: Family

## 2022-11-20 DIAGNOSIS — M25552 Pain in left hip: Secondary | ICD-10-CM | POA: Diagnosis not present

## 2022-11-20 DIAGNOSIS — M25551 Pain in right hip: Secondary | ICD-10-CM | POA: Diagnosis not present

## 2022-11-25 DIAGNOSIS — M25551 Pain in right hip: Secondary | ICD-10-CM | POA: Diagnosis not present

## 2022-11-25 DIAGNOSIS — M25552 Pain in left hip: Secondary | ICD-10-CM | POA: Diagnosis not present

## 2022-11-27 DIAGNOSIS — M25552 Pain in left hip: Secondary | ICD-10-CM | POA: Diagnosis not present

## 2022-11-27 DIAGNOSIS — M25551 Pain in right hip: Secondary | ICD-10-CM | POA: Diagnosis not present

## 2022-12-02 DIAGNOSIS — M25552 Pain in left hip: Secondary | ICD-10-CM | POA: Diagnosis not present

## 2022-12-02 DIAGNOSIS — M25551 Pain in right hip: Secondary | ICD-10-CM | POA: Diagnosis not present

## 2022-12-04 DIAGNOSIS — M25552 Pain in left hip: Secondary | ICD-10-CM | POA: Diagnosis not present

## 2022-12-04 DIAGNOSIS — M25551 Pain in right hip: Secondary | ICD-10-CM | POA: Diagnosis not present

## 2022-12-14 ENCOUNTER — Ambulatory Visit (INDEPENDENT_AMBULATORY_CARE_PROVIDER_SITE_OTHER): Payer: Medicare Other | Admitting: Family Medicine

## 2022-12-14 ENCOUNTER — Telehealth: Payer: Self-pay | Admitting: Family

## 2022-12-14 ENCOUNTER — Encounter: Payer: Self-pay | Admitting: Family Medicine

## 2022-12-14 VITALS — BP 134/82 | HR 63 | Resp 12 | Ht 60.0 in | Wt 195.8 lb

## 2022-12-14 DIAGNOSIS — R42 Dizziness and giddiness: Secondary | ICD-10-CM | POA: Diagnosis not present

## 2022-12-14 LAB — BASIC METABOLIC PANEL
BUN: 23 mg/dL (ref 6–23)
CO2: 28 mEq/L (ref 19–32)
Calcium: 9.5 mg/dL (ref 8.4–10.5)
Chloride: 101 mEq/L (ref 96–112)
Creatinine, Ser: 1.01 mg/dL (ref 0.40–1.20)
GFR: 53.1 mL/min — ABNORMAL LOW (ref >60.00)
Glucose, Bld: 80 mg/dL (ref 70–99)
Potassium: 4.4 mEq/L (ref 3.5–5.1)
Sodium: 138 mEq/L (ref 135–145)

## 2022-12-14 NOTE — Progress Notes (Signed)
Subjective:     Patient ID: Heather Cox, female    DOB: 1946/03/02, 77 y.o.   MRN: [id number]  Chief Complaint  Patient presents with   Dizziness    Dizziness   Patient is in today for dizziness. She reports dizziness started on Saturday when she woke up. She reports the dizziness only lasted while getting up out of bed, maybe lasted seconds to 1/2 minute. Dizziness resolved. Then, later that morning she had another episode of dizziness when bending over to clean cat liter. She reports the dizziness lasted about 15 seconds, but the dizziness was severe enough that she needed to hold on to something. Denies any dizziness episodes the rest of Saturday. On Sunday, she recalls that she had another dizzy episode while bending over the cat liter. She reports the dizziness lasted about 15 seconds, but more severe than Saturdays episode. She does recall if she had another episode of dizziness yesterday evening. Also, she didn't wake up with dizziness yesterday morning. Today, she woke up with dizziness and a headache. She reports the dizziness was not as bad this morning, lasting on seconds. She reports her head feels "real funny". She denies bending over today for any activities.  She reports blurry vision only when bending over on Saturday and Sunday.  She reports she has only took her usual blood pressure medication and Anason today for headache. She reports the headache is like having a rubber band around her head. Rates pain 0/10 now, but at worst it was a 7/10. She reports the headache lasted about 1-1.5 hrs. She reports today she feels jittery today, but denies this on Saturday or Sunday.  Denies chest pain or shortness of breath.  She denies an decrease in fluid intake. She thinks she gets more than 64 oz of fluids a day.  Health Maintenance Due  Topic Date Due   Hepatitis C Screening  Never done    Past Medical History:  Diagnosis Date   Arthritis    GERD (gastroesophageal reflux  disease)    occasionally and will take Tums if needed   History of colon polyps    benign   Hyperlipidemia    takes Atorvastatin daily   Hypertension    takes Atenolol daily   Joint pain     Past Surgical History:  Procedure Laterality Date   ABDOMINAL HYSTERECTOMY  1994   CARDIAC CATHETERIZATION  2017   03/25/16 ([facility]): Normal coronaries (EF 60-65% by echo)   cataract surgery Bilateral    CESAREAN SECTION  1977   COLONOSCOPY     COLONOSCOPY N/A 06/10/2017   Procedure: COLONOSCOPY;  Surgeon: Rogene Houston, MD;  [facility];  Service: Endoscopy;  Laterality: N/A;  [place] Right 06/08/2016   Procedure: RIGHT TOTAL KNEE ARTHROPLASTY WITH COMPUTER NAVIGATION;  Surgeon: Rod Can, MD;  Location: [place];  Service: Orthopedics;  Laterality: Right;  Request RNFA    TONSILLECTOMY     at age 67   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   Procedure: TOTAL KNEE ARTHROPLASTY with Navigation;  Surgeon: Rod Can, MD;  Location: [place];  Service: Orthopedics;  Laterality: Left;   WRIST SURGERY Left 7782   YAG LASER APPLICATION Left 03/01/5360   Procedure: YAG LASER APPLICATION;  Surgeon: Williams Che, MD;  Location: [facility];  Service: Ophthalmology;  Laterality: Left;  rescheduled to 2/20    YAG LASER APPLICATION Right 44/31/5400   Procedure: YAG LASER APPLICATION;  Surgeon:  Williams Che, MD;  Location: [facility];  Service: Ophthalmology;  Laterality: Right;    No family history on file.  Social History   Socioeconomic History   Marital status: Widowed    Spouse name: Not on file   Number of children: Not on file   Years of education: Not on file   Highest education level: Not on file  Occupational History   Not on file  Tobacco Use   Smoking status: Never   Smokeless tobacco: Never  Substance and Sexual Activity   Alcohol use: No   Drug use: No   Sexual activity: Not on file  Other Topics Concern   Not on file  Social History Narrative    Not on file   Social Determinants of Health   Financial Resource Strain: Low Risk  (02/24/2022)   Overall Financial Resource Strain (CARDIA)    Difficulty of Paying Living Expenses: Not hard at all  Food Insecurity: No Food Insecurity (02/24/2022)   Hunger Vital Sign    Worried About Running Out of Food in the Last Year: Never true    Ran Out of Food in the Last Year: Never true  Transportation Needs: No Transportation Needs (02/24/2022)   PRAPARE - Hydrologist (Medical): No    Lack of Transportation (Non-Medical): No  Physical Activity: Inactive (02/24/2022)   Exercise Vital Sign    Days of Exercise per Week: 0 days    Minutes of Exercise per Session: 0 min  Stress: No Stress Concern Present (02/24/2022)   [place]    Feeling of Stress : Not at all  Social Connections: Moderately Integrated (02/24/2022)   Social Connection and Isolation Panel [NHANES]    Frequency of Communication with Friends and Family: Twice a week    Frequency of Social Gatherings with Friends and Family: More than three times a week    Attends Religious Services: More than 4 times per year    Active Member of [employer] or Organizations: Yes    Attends Archivist Meetings: 1 to 4 times per year    Marital Status: Widowed  Intimate Partner Violence: Not At Risk (02/24/2022)   Humiliation, Afraid, Rape, and Kick questionnaire    Fear of Current or Ex-Partner: No    Emotionally Abused: No    Physically Abused: No    Sexually Abused: No    Outpatient Medications Prior to Visit  Medication Sig Dispense Refill   amoxicillin (AMOXIL) 500 MG capsule TAKE 4 CAPSULES BY MOUTH 1-2 HOURS PRIOR TO DENTAL WORK  0   Aspirin-Caffeine 400-32 MG TABS Take 2 tablets by mouth 2 (two) times daily as needed (for pain or headache).     atenolol (TENORMIN) 25 MG tablet TAKE 1 TABLET(25 MG) BY MOUTH DAILY 90 tablet 3    atorvastatin (LIPITOR) 10 MG tablet Take 1 tablet (10 mg total) by mouth every evening. 90 tablet 3   calcium carbonate (TUMS - DOSED IN MG ELEMENTAL CALCIUM) 500 MG chewable tablet Chew 1 tablet by mouth daily as needed for indigestion or heartburn.     Cholecalciferol (VITAMIN D) 50 MCG (2000 UT) CAPS Take by mouth.     ibuprofen (ADVIL,MOTRIN) 200 MG tablet Take 400 mg by mouth 2 (two) times daily as needed for headache or moderate pain.     No facility-administered medications prior to visit.    Allergies  Allergen Reactions   Shellfish Allergy Anaphylaxis,  Itching and Swelling    Tongue swelling, lips numb Tongue swelling, lips numb   Shrimp (Diagnostic) Itching    Mouth swelling and rash     Review of Systems  Neurological:  Positive for dizziness.   See HPI     Objective:    Physical Exam Constitutional:      General: She is not in acute distress.    Appearance: Normal appearance. She is not ill-appearing.  HENT:     Head: Normocephalic.     Right Ear: There is impacted cerumen.     Left Ear: There is impacted cerumen.     Ears:     Comments: Wears bilateral earring aids  Eyes:     Extraocular Movements: Extraocular movements intact.     Right eye: No nystagmus.     Left eye: No nystagmus.     Pupils: Pupils are equal, round, and reactive to light.  Cardiovascular:     Rate and Rhythm: Normal rate and regular rhythm.     Heart sounds: Normal heart sounds. No murmur heard.    No friction rub. No gallop.  Pulmonary:     Effort: Pulmonary effort is normal. No respiratory distress.     Breath sounds: Normal breath sounds.  Skin:    General: Skin is warm and dry.  Neurological:     General: No focal deficit present.     Mental Status: She is alert and oriented to person, place, and time.     Cranial Nerves: Cranial nerves 2-12 are intact.     Sensory: Sensation is intact.     Motor: No weakness or abnormal muscle tone.  Psychiatric:        Mood and Affect:  Mood normal.        Behavior: Behavior normal.        Thought Content: Thought content normal.        Judgment: Judgment normal.     BP 134/82 (BP Location: Left Arm, Cuff Size: Large)   Pulse 63   Resp 12   Ht 5' (1.524 m)   Wt 195 lb 12.8 oz (88.8 kg)   SpO2 99%   BMI 38.24 kg/m  Wt Readings from Last 3 Encounters:  12/14/22 195 lb 12.8 oz (88.8 kg)  09/17/22 197 lb 6.4 oz (89.5 kg)  09/12/21 215 lb 3.2 oz (97.6 kg)       Assessment & Plan:   Problem List Items Addressed This Visit   None Visit Diagnoses     Dizziness    -  Primary   Relevant Orders   EKG 12-Lead (Completed)       I am having Mardene Celeste B. Hamza maintain her Aspirin-Caffeine, calcium carbonate, amoxicillin, ibuprofen, Vitamin D, atorvastatin, and atenolol.  No orders of the defined types were placed in this encounter.

## 2022-12-14 NOTE — Telephone Encounter (Signed)
Patient called to advise that she has been having dizzy spells since Saturday and it's been getting worse. Patient said if she bends over she has to hang on to something to keep from losing her balance. Patient has no nausea but does have headaches. Transferred to triage.

## 2022-12-14 NOTE — Assessment & Plan Note (Addendum)
EKG performed for dizziness with no concerns or reason for dizziness. EKG was sinus rhythm with no ST elevation.Referral placed for physical therapy to evaluate and treat for dizziness. Recommend to drink at least 64 oz of fluids a day, preferable water. Cautioned patient when changing positions with possibly experiencing dizziness. Advised patient to go to the emergency department if she experiences [facility] or chest pain. Follow up sooner if her symptoms become worse before having an appointment with physical therapy. Recommend to take Anacin or Ibuprofen for headache as she usually does if she has a headache since it has been beneficial.

## 2022-12-14 NOTE — Telephone Encounter (Signed)
Initial Comment Caller states, needs an appt. with her doctor. She has been sick since Sat. morning. Very dizzy and eyesight was not focus. Got better. Yesterday she had another attack when she bended over to feed the cat. This morning very dizzy again, needs to hang on to something, if not she will fall over. Translation No Nurse Assessment Nurse: Nicole Cella, RN, [place] Date/Time (Eastern Time): 12/14/2022 8:30:50 AM Confirm and document reason for call. If symptomatic, describe symptoms. ---Caller states having dizzy spells with blurred vision since Saturday morning when getting out of bed, having headaches as well. Dizzy spells causing caller to almost fall over with lightheadness. Denies fever Does the patient have any new or worsening symptoms? ---Yes Will a triage be completed? ---Yes Related visit to physician within the last 2 weeks? ---No Does the PT have any chronic conditions? (i.e. diabetes, asthma, this includes High risk factors for pregnancy, etc.) ---Yes List chronic conditions. ---HTN HLD Is this a behavioral health or substance abuse call? ---No Guidelines Guideline Title Affirmed Question Affirmed Notes Nurse Date/Time (Eastern Time) Dizziness - Lightheadedness [1] Dizziness caused by heat exposure, sudden standing, or poor fluid intake AND [2] no improvement after [place], RN, [place] 12/14/2022 8:34:34 AM PLEASE NOTE: All timestamps contained within this report are represented as [place] Standard Time. CONFIDENTIALTY NOTICE: This fax transmission is intended only for the addressee. It contains information that is legally privileged, confidential or otherwise protected from use or disclosure. If you are not the intended recipient, you are strictly prohibited from reviewing, disclosing, copying using or disseminating any of this information or taking any action in reliance on or regarding this information. If you have received this fax in error, please notify  us immediately by telephone so that we can arrange for its return to [place]. Phone: [phone], Toll-Free: [phone], Fax: [phone] Page: 2 of 2 Call Id: [id number] Guidelines Guideline Title Affirmed Question Affirmed Notes Nurse Date/Time Eilene Ghazi Time) 2 hours of rest and fluids Disp. Time Eilene Ghazi Time) Disposition Final User 12/14/2022 8:44:06 AM See HCP within 4 Hours (or PCP triage) Yes Nicole Cella, RN, [place] Final Disposition 12/14/2022 8:44:06 AM See HCP within 4 Hours (or PCP triage) Yes Nicole Cella, RN, [place] Caller Disagree/Comply Comply Caller Understands Yes PreDisposition Did not know what to do Care Advice Given Per Guideline SEE HCP (OR PCP TRIAGE) WITHIN 4 HOURS: * IF OFFICE WILL BE OPEN: You need to be seen within the next 3 or 4 hours. Call your doctor (or NP/PA) now or as soon as the office opens. DRINK FLUIDS: * Drink several glasses of fruit juice, other clear fluids or water. * This will improve hydration and blood glucose. * If the weather is hot or you have a fever, make sure the fluids are cold. LIE DOWN AND REST: * Lie down with feet elevated for 1 hour. * This will improve circulation and increase blood flow to the brain. CALL BACK IF: * Passes out (faints) * You become worse CARE ADVICE given per Dizziness (Adult) guideline. Comments User: Prudence Davidson, RN Date/Time Eilene Ghazi Time): 12/14/2022 8:44:20 AM appt made for 12pm Referrals REFERRED TO PCP OFFICE Warm transfer to backline

## 2022-12-14 NOTE — Telephone Encounter (Signed)
Appt scheduled

## 2022-12-16 ENCOUNTER — Encounter: Payer: Self-pay | Admitting: Rehabilitative and Restorative Service Providers"

## 2022-12-16 ENCOUNTER — Ambulatory Visit: Payer: Medicare Other | Attending: Family | Admitting: Rehabilitative and Restorative Service Providers"

## 2022-12-16 ENCOUNTER — Other Ambulatory Visit: Payer: Self-pay

## 2022-12-16 DIAGNOSIS — H8111 Benign paroxysmal vertigo, right ear: Secondary | ICD-10-CM | POA: Insufficient documentation

## 2022-12-16 DIAGNOSIS — H8112 Benign paroxysmal vertigo, left ear: Secondary | ICD-10-CM | POA: Diagnosis not present

## 2022-12-16 DIAGNOSIS — R42 Dizziness and giddiness: Secondary | ICD-10-CM | POA: Insufficient documentation

## 2022-12-16 NOTE — Therapy (Addendum)
OUTPATIENT PHYSICAL THERAPY VESTIBULAR EVALUATION   Patient Name: Heather Cox MRN: [id number] DOB:August 31, 1946, 77 y.o., female Today's Date: 12/16/2022  END OF SESSION:  PT End of Session - 12/16/22 1100     Visit Number 1    Number of Visits 8    Date for PT Re-Evaluation 01/15/23    Authorization Type [employer] medicare    PT Start Time 1105    PT Stop Time 1145    PT Time Calculation (min) 40 min    Activity Tolerance Patient tolerated treatment well    Behavior During Therapy WFL for tasks assessed/performed             Past Medical History:  Diagnosis Date   Arthritis    GERD (gastroesophageal reflux disease)    occasionally and will take Tums if needed   History of colon polyps    benign   Hyperlipidemia    takes Atorvastatin daily   Hypertension    takes Atenolol daily   Joint pain    Past Surgical History:  Procedure Laterality Date   ABDOMINAL HYSTERECTOMY  1994   CARDIAC CATHETERIZATION  2017   03/25/16 ([facility]): Normal coronaries (EF 60-65% by echo)   cataract surgery Bilateral    CESAREAN SECTION  1977   COLONOSCOPY     COLONOSCOPY N/A 06/10/2017   Procedure: COLONOSCOPY;  Surgeon: Rogene Houston, MD;  [facility];  Service: Endoscopy;  Laterality: N/A;  [place] Right 06/08/2016   Procedure: RIGHT TOTAL KNEE ARTHROPLASTY WITH COMPUTER NAVIGATION;  Surgeon: Rod Can, MD;  Location: [place];  Service: Orthopedics;  Laterality: Right;  Request RNFA    TONSILLECTOMY     at age 20   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   Procedure: TOTAL KNEE ARTHROPLASTY with Navigation;  Surgeon: Rod Can, MD;  Location: [place];  Service: Orthopedics;  Laterality: Left;   WRIST SURGERY Left [id number]   YAG LASER APPLICATION Left [id number]   Procedure: YAG LASER APPLICATION;  Surgeon: Williams Che, MD;  [facility];  Service: Ophthalmology;  Laterality: Left;  rescheduled to 2/20    YAG LASER APPLICATION Right [id number]    Procedure: YAG LASER APPLICATION;  Surgeon: Williams Che, MD;  Location: [facility];  Service: Ophthalmology;  Laterality: Right;   Patient Active Problem List   Diagnosis Date Noted   Dizziness 12/14/2022   Hx of colonic polyps 02/11/2017   Osteoarthritis of left knee 11/30/2016   Degenerative arthritis of left knee 11/30/2016    PCP: Marrian Salvage, FNP REFERRING PROVIDER: Marrian Salvage, FNP  REFERRING DIAG: R42 (ICD-10-CM) - Dizziness   THERAPY DIAG:  BPPV (benign paroxysmal positional vertigo), right  BPPV (benign paroxysmal positional vertigo), left  Dizziness and giddiness  ONSET DATE: 12/12/2022  Rationale for Evaluation and Treatment: Rehabilitation  SUBJECTIVE:   SUBJECTIVE STATEMENT: The patient reports dizziness that began over the weekend. She is avoiding bending forward and is squatting to clean out the litter box. She is getting up slowly and notices the numbers on her clock are moving. She sits on the edge of her bed until the visual jumping subsides. She rates dizziness as a 2/10 described as "lightheadedness" when at rest. The 2 episodes that were more severe are rated an 8/10. Those sensations lasted x seconds. She did have to hold on to avoid falling. She is also noting unsteadiness since vertigo began this weekend. Pt accompanied by: self  PERTINENT HISTORY: Arthritis, GERD, hyperlipidemia,  HTN  PAIN:  Are you having pain? Yes, some headache. Baseline level x years. Severity: 1/10, can get worse up to a 6/10 at times. Aggravating: worse in the morning Alleviating: Meds reduce pain  PRECAUTIONS: None  WEIGHT BEARING RESTRICTIONS: No  FALLS: Has patient fallen in last 6 months? No  LIVING ENVIRONMENT: Lives with: lives alone Lives in: House/apartment Stairs: No  PLOF: Independent  PATIENT GOALS: reduce dizziness  OBJECTIVE:   POSTURE:  rounded shoulders and forward head  Cervical ROM:    Active A/PROM (deg) eval  Flexion  WFLs  Extension WFLs  Right lateral flexion   Left lateral flexion   Right rotation WFLs  Left rotation WFLs  (Blank rows = not tested)  PATIENT SURVEYS:  FOTO 59  VESTIBULAR ASSESSMENT:  SYMPTOM BEHAVIOR:  Subjective history: onset this weekend, however notes h/o of not tolerating dental chair positiion  Non-Vestibular symptoms: headaches and lightheadedness  Type of dizziness: Imbalance (Disequilibrium), Spinning/Vertigo, Unsteady with head/body turns, Lightheadedness/Faint, and "World moves"  Frequency: intermittent, daily  Duration: seconds  Aggravating factors: Worse in the morning and sitting upright, bending forward  Relieving factors: head stationary  Progression of symptoms: better  OCULOMOTOR EXAM:  Ocular Alignment: normal  Ocular ROM: No Limitations  Spontaneous Nystagmus: absent  Gaze-Induced Nystagmus: absent  Smooth Pursuits: intact  Saccades: intact   VESTIBULAR - OCULAR REFLEX:   Slow VOR: Comment: has 1-2 corrections to target during 5 reps  Head-Impulse Test: HIT Right: positive HIT Left: positive    POSITIONAL TESTING: Right Dix-Hallpike: Unable to tolerate position noting she does not tolerate dentist chair Left Dix-Hallpike: Unable to tolerate Right Roll Test: mild sensation, no nystagmus Left Roll Test: negative Right Sidelying: upbeating, right nystagmus *larger amplitude nystagmus with longer duration to the right Left Sidelying: upbeating, left nystagmus x 10 second duration with subjective report of room movement  [facility] Adult PT Treatment:                                                DATE: 12/16/22 Canolith Repositioning PT initiated R epley's maneuver During 3rd position in rolling onto L shoulder, the patient had increased dizziness with horizontal (R beating nystagmus noted) --- rolled back to supine to allow nystagmus to settle, taking approximately 1 minute to subside Neuromuscular re-ed: Retested horizontal roll after noting quality and  direction of nystagmus change with R epley's maneuver.  No nystagmus or reports of dizziness with retest Sat at edge of treatment table x 5 minutes before standing Self Care: Moving slowly to reduce positional symptoms, sitting after rising to ensure safe to stand  PATIENT EDUCATION: Education details: HEP Person educated: Patient Education method: Explanation Education comprehension: verbalized understanding  HOME EXERCISE PROGRAM: To be added  GOALS: Goals reviewed with patient? Yes  LONG TERM GOALS: Target date: 01/13/2023    Patient will be independent with progressed HEP to improve outcomes and carryover.  Baseline: None initiated at eval Goal status: INITIAL  2.  Patient will report 75% improvement in dizziness. Baseline:  dizziness with functional tasks Goal status: INITIAL  3.    The patient will improve FOTO from 59% up to 65%.   Goal status: INITIAL  6.  The patient will have negative positional testing. Baseline: + bilat sidelying tests Goal status: INITIAL   ASSESSMENT:  CLINICAL IMPRESSION: Patient is a 77 y.o. female  who was seen today for physical therapy evaluation and treatment for dizziness. She initially declined dix hallpike testing, but did tolerate sidelying tests.  This revealed + bilat BPPV for posterior canalithiasis worse on the R side (nystagmus amplitude and duration worse). We initiated R epley's maneuver modified with pillows at her upper back, however noted a transition to horizontal canal BPPV during rolling. Rested and retested with patient without nystagmus during horizontal roll test. Plan to reassess next session and treat, as indicated.   OBJECTIVE IMPAIRMENTS: decreased activity tolerance, decreased balance, and dizziness.   ACTIVITY LIMITATIONS: bending, bed mobility, and locomotion level  PARTICIPATION LIMITATIONS: cleaning  PERSONAL FACTORS: 1-2 comorbidities: HTN, arthritis  are also affecting patient's functional outcome.    REHAB POTENTIAL: Good  CLINICAL DECISION MAKING: Stable/uncomplicated  EVALUATION COMPLEXITY: Low   PLAN:  PT FREQUENCY: 1-2x/week  PT DURATION: 4 weeks  PLANNED INTERVENTIONS: Therapeutic exercises, Therapeutic activity, Neuromuscular re-education, Balance training, Gait training, Patient/Family education, Self Care, Vestibular training, Canalith repositioning, and Manual therapy  PLAN FOR NEXT SESSION: reassess BPPV (all canals) and treat as indicated.    [place], PT 12/16/2022, 1:28 PM

## 2022-12-23 ENCOUNTER — Ambulatory Visit: Payer: Medicare Other | Admitting: Rehabilitative and Restorative Service Providers"

## 2022-12-23 ENCOUNTER — Encounter: Payer: Self-pay | Admitting: Rehabilitative and Restorative Service Providers"

## 2022-12-23 DIAGNOSIS — H8112 Benign paroxysmal vertigo, left ear: Secondary | ICD-10-CM

## 2022-12-23 DIAGNOSIS — R42 Dizziness and giddiness: Secondary | ICD-10-CM | POA: Diagnosis not present

## 2022-12-23 DIAGNOSIS — H8111 Benign paroxysmal vertigo, right ear: Secondary | ICD-10-CM

## 2022-12-23 NOTE — Therapy (Signed)
OUTPATIENT PHYSICAL THERAPY VESTIBULAR TREATMENT   Patient Name: Heather Cox MRN: [id number] DOB:09/05/1946, 77 y.o., female Today's Date: 12/23/2022  END OF SESSION:  PT End of Session - 12/23/22 1106     Visit Number 2    Number of Visits 8    Date for PT Re-Evaluation 01/15/23    Authorization Type [employer] medicare    PT Start Time 1107    PT Stop Time 1145    PT Time Calculation (min) 38 min    Activity Tolerance Patient tolerated treatment well    Behavior During Therapy WFL for tasks assessed/performed             Past Medical History:  Diagnosis Date   Arthritis    GERD (gastroesophageal reflux disease)    occasionally and will take Tums if needed   History of colon polyps    benign   Hyperlipidemia    takes Atorvastatin daily   Hypertension    takes Atenolol daily   Joint pain    Past Surgical History:  Procedure Laterality Date   ABDOMINAL HYSTERECTOMY  1994   CARDIAC CATHETERIZATION  2017   03/25/16 ([facility]): Normal coronaries (EF 60-65% by echo)   cataract surgery Bilateral    CESAREAN SECTION  1977   COLONOSCOPY     COLONOSCOPY N/A 06/10/2017   Procedure: COLONOSCOPY;  Surgeon: Rogene Houston, MD;  [facility];  Service: Endoscopy;  Laterality: N/A;  [place] Right 06/08/2016   Procedure: RIGHT TOTAL KNEE ARTHROPLASTY WITH COMPUTER NAVIGATION;  Surgeon: Rod Can, MD;  Location: [place];  Service: Orthopedics;  Laterality: Right;  Request RNFA    TONSILLECTOMY     at age 46   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   Procedure: TOTAL KNEE ARTHROPLASTY with Navigation;  Surgeon: Rod Can, MD;  Location: [place];  Service: Orthopedics;  Laterality: Left;   WRIST SURGERY Left [id number]   YAG LASER APPLICATION Left [id number]   Procedure: YAG LASER APPLICATION;  Surgeon: Williams Che, MD;  [facility];  Service: Ophthalmology;  Laterality: Left;  rescheduled to 2/20    YAG LASER APPLICATION Right [id number]    Procedure: YAG LASER APPLICATION;  Surgeon: Williams Che, MD;  [facility];  Service: Ophthalmology;  Laterality: Right;   Patient Active Problem List   Diagnosis Date Noted   Dizziness 12/14/2022   Hx of colonic polyps 02/11/2017   Osteoarthritis of left knee 11/30/2016   Degenerative arthritis of left knee 11/30/2016    PCP: Marrian Salvage, FNP REFERRING PROVIDER: Marrian Salvage, FNP  REFERRING DIAG: R42 (ICD-10-CM) - Dizziness   THERAPY DIAG:  BPPV (benign paroxysmal positional vertigo), right  BPPV (benign paroxysmal positional vertigo), left  Dizziness and giddiness  ONSET DATE: 12/12/2022  Rationale for Evaluation and Treatment: Rehabilitation  SUBJECTIVE:   SUBJECTIVE STATEMENT: The patient reports that the clock movement has improved since eval. She did bend forward yesterday and noticed some dizziness still present. She is still needing to sit and let herself gain balance before standing in the mornings. Dizziness is a 2/10. "My head feels hollow" and notes she can hear herself talk.   EVAL: The patient reports dizziness that began over the weekend. She is avoiding bending forward and is squatting to clean out the litter box. She is getting up slowly and notices the numbers on her clock are moving. She sits on the edge of her bed until the visual jumping subsides. She  rates dizziness as a 2/10 described as "lightheadedness" when at rest. The 2 episodes that were more severe are rated an 8/10. Those sensations lasted x seconds. She did have to hold on to avoid falling. She is also noting unsteadiness since vertigo began this weekend.  PERTINENT HISTORY: Arthritis, GERD, hyperlipidemia, HTN  PAIN:  Are you having pain? Yes, some headache. Baseline level x years. Severity: 1/10, can get worse up to a 6/10 at times. Aggravating: worse in the morning Alleviating: Meds reduce pain  PRECAUTIONS: None  WEIGHT BEARING RESTRICTIONS: No  FALLS: Has  patient fallen in last 6 months? No  PATIENT GOALS: reduce dizziness  OBJECTIVE:  (Measures in this section from initial evaluation unless otherwise noted)  VESTIBULAR ASSESSMENT: SYMPTOM BEHAVIOR:  Subjective history: onset this weekend, however notes h/o of not tolerating dental chair positiion  Non-Vestibular symptoms: headaches and lightheadedness  Type of dizziness: Imbalance (Disequilibrium), Spinning/Vertigo, Unsteady with head/body turns, Lightheadedness/Faint, and "World moves"  Frequency: intermittent, daily  Duration: seconds  Aggravating factors: Worse in the morning and sitting upright, bending forward  Relieving factors: head stationary  Progression of symptoms: better  OCULOMOTOR EXAM:  Ocular Alignment: normal  Ocular ROM: No Limitations  Spontaneous Nystagmus: absent  Gaze-Induced Nystagmus: absent  Smooth Pursuits: intact  Saccades: intact   VESTIBULAR - OCULAR REFLEX:   Slow VOR: Comment: has 1-2 corrections to target during 5 reps  Head-Impulse Test: HIT Right: positive HIT Left: positive    POSITIONAL TESTING: Right Dix-Hallpike: Unable to tolerate position noting she does not tolerate dentist chair Left Dix-Hallpike: Unable to tolerate Right Roll Test: mild sensation, no nystagmus Left Roll Test: negative Right Sidelying: upbeating, right nystagmus *larger amplitude nystagmus with longer duration to the right Left Sidelying: upbeating, left nystagmus x 10 second duration with subjective report of room movement  [facility] Adult PT Treatment:                                                DATE: 12/23/22 Neuromuscular re-ed: L sidelying test provokes a sensation that "dizziness could come on", but it settles. When attempting to return to sitting, PT notes L beating horizontal nystagmus x 1 minute-- moved back to sidelying Rolling R provokes mild geotropic nystagmus Rolling L provokes more significant geotropic nystagmus Retested horizontal roll after BBQ and  still + in L sidelying Canolith repositioning BBQ roll L side moving through prone on elbows x 1 rep Gufoni for L horizontal canlaithiasis moving onto R for sidelying x 1 minute, then nose moved to point towards floor x 76mnute and then return to sitting.  [facility]                                                [facility]: [facility]22 Canolith Repositioning PT initiated R epley's maneuver During 3rd position in rolling onto L shoulder, the patient had increased dizziness with horizontal (R beating nystagmus noted) --- rolled back to supine to allow nystagmus to settle, taking approximately 1 minute to subside Neuromuscular re-ed: Retested horizontal roll after noting quality and direction of nystagmus change with R epley's maneuver.  No nystagmus or reports of dizziness with retest Sat at edge of treatment table x 5 minutes before standing  Self Care: Moving slowly to reduce positional symptoms, sitting after rising to ensure safe to stand  PATIENT EDUCATION: Education details: HEP Person educated: Patient Education method: Explanation Education comprehension: verbalized understanding  HOME EXERCISE PROGRAM: To be added  GOALS: Goals reviewed with patient? Yes  LONG TERM GOALS: Target date: 01/13/2023    Patient will be independent with progressed HEP to improve outcomes and carryover.  Baseline: None initiated at eval Goal status: INITIAL  2.  Patient will report 75% improvement in dizziness. Baseline:  dizziness with functional tasks Goal status: INITIAL  3.    The patient will improve FOTO from 59% up to 65%.   Goal status: INITIAL  6.  The patient will have negative positional testing. Baseline: + bilat sidelying tests Goal status: INITIAL   ASSESSMENT:  CLINICAL IMPRESSION: The patient continues with BPPV, L canalithiasis today. She tolerated exercises well, however symptoms persisted after 2 rounds of BBQ roll. PT performed Gufoni and will reassess next visit  to check on progress.    OBJECTIVE IMPAIRMENTS: decreased activity tolerance, decreased balance, and dizziness.   PLAN:  PT FREQUENCY: 1-2x/week  PT DURATION: 4 weeks  PLANNED INTERVENTIONS: Therapeutic exercises, Therapeutic activity, Neuromuscular re-education, Balance training, Gait training, Patient/Family education, Self Care, Vestibular training, Canalith repositioning, and Manual therapy  PLAN FOR NEXT SESSION: reassess BPPV (all canals) and treat as indicated.    [place], PT 12/23/2022, 11:06 AM

## 2022-12-25 ENCOUNTER — Encounter: Payer: Self-pay | Admitting: Rehabilitative and Restorative Service Providers"

## 2022-12-25 ENCOUNTER — Ambulatory Visit: Payer: Medicare Other | Admitting: Rehabilitative and Restorative Service Providers"

## 2022-12-25 DIAGNOSIS — H8112 Benign paroxysmal vertigo, left ear: Secondary | ICD-10-CM | POA: Diagnosis not present

## 2022-12-25 DIAGNOSIS — H8111 Benign paroxysmal vertigo, right ear: Secondary | ICD-10-CM | POA: Diagnosis not present

## 2022-12-25 DIAGNOSIS — R42 Dizziness and giddiness: Secondary | ICD-10-CM | POA: Diagnosis not present

## 2022-12-25 NOTE — Therapy (Signed)
OUTPATIENT PHYSICAL THERAPY VESTIBULAR TREATMENT  Patient Name: Heather Cox MRN: [id number] DOB:[birth year], 77 y.o., female Today's Date: 12/25/2022  END OF SESSION:  PT End of Session - 12/25/22 0935     Visit Number 3    Number of Visits 8    Date for PT Re-Evaluation 01/15/23    Authorization Type [employer] medicare    Authorization - Visit Number 3    Progress Note Due on Visit 10    PT Start Time [phone]    PT Stop Time 1015    PT Time Calculation (min) 39 min    Activity Tolerance Patient tolerated treatment well    Behavior During Therapy WFL for tasks assessed/performed             Past Medical History:  Diagnosis Date   Arthritis    GERD (gastroesophageal reflux disease)    occasionally and will take Tums if needed   History of colon polyps    benign   Hyperlipidemia    takes Atorvastatin daily   Hypertension    takes Atenolol daily   Joint pain    Past Surgical History:  Procedure Laterality Date   ABDOMINAL HYSTERECTOMY  1994   CARDIAC CATHETERIZATION  2017   03/25/16 ([facility]): Normal coronaries (EF 60-65% by echo)   cataract surgery Bilateral    CESAREAN SECTION  1977   COLONOSCOPY     COLONOSCOPY N/A 06/10/2017   Procedure: COLONOSCOPY;  Surgeon: Rogene Houston, MD;  [facility];  Service: Endoscopy;  Laterality: N/A;  [place] Right 06/08/2016   Procedure: RIGHT TOTAL KNEE ARTHROPLASTY WITH COMPUTER NAVIGATION;  Surgeon: Rod Can, MD;  Location: [place];  Service: Orthopedics;  Laterality: Right;  Request RNFA    TONSILLECTOMY     at age 62   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   Procedure: TOTAL KNEE ARTHROPLASTY with Navigation;  Surgeon: Rod Can, MD;  Location: [place];  Service: Orthopedics;  Laterality: Left;   WRIST SURGERY Left [id number]   YAG LASER APPLICATION Left [id number]   Procedure: YAG LASER APPLICATION;  Surgeon: Williams Che, MD;  [facility];  Service: Ophthalmology;  Laterality: Left;   rescheduled to 2/20    YAG LASER APPLICATION Right [id number]   Procedure: YAG LASER APPLICATION;  Surgeon: Williams Che, MD;  [facility];  Service: Ophthalmology;  Laterality: Right;   Patient Active Problem List   Diagnosis Date Noted   Dizziness 12/14/2022   Hx of colonic polyps 02/11/2017   Osteoarthritis of left knee 11/30/2016   Degenerative arthritis of left knee 11/30/2016    PCP: Marrian Salvage, FNP REFERRING PROVIDER: Marrian Salvage, FNP  REFERRING DIAG: R42 (ICD-10-CM) - Dizziness   THERAPY DIAG:  BPPV (benign paroxysmal positional vertigo), right  BPPV (benign paroxysmal positional vertigo), left  Dizziness and giddiness  ONSET DATE: 12/12/2022  Rationale for Evaluation and Treatment: Rehabilitation  SUBJECTIVE:   SUBJECTIVE STATEMENT: The patient reports vertigo felt somewhat improved, but last night, she got a bad episode when moving to lie down to get into bed.   EVAL: The patient reports dizziness that began over the weekend. She is avoiding bending forward and is squatting to clean out the litter box. She is getting up slowly and notices the numbers on her clock are moving. She sits on the edge of her bed until the visual jumping subsides. She rates dizziness as a 2/10 described as "lightheadedness" when at rest. The 2  episodes that were more severe are rated an 8/10. Those sensations lasted x seconds. She did have to hold on to avoid falling. She is also noting unsteadiness since vertigo began this weekend.  PERTINENT HISTORY: Arthritis, GERD, hyperlipidemia, HTN  PAIN:  Are you having pain? Yes, some headache. Baseline level x years. Severity: 1/10, can get worse up to a 6/10 at times. Aggravating: worse in the morning Alleviating: Meds reduce pain  PRECAUTIONS: None  WEIGHT BEARING RESTRICTIONS: No  FALLS: Has patient fallen in last 6 months? No  PATIENT GOALS: reduce dizziness  OBJECTIVE:  (Measures in this section from  initial evaluation unless otherwise noted)  VESTIBULAR ASSESSMENT: SYMPTOM BEHAVIOR:  Subjective history: onset this weekend, however notes h/o of not tolerating dental chair positiion  Non-Vestibular symptoms: headaches and lightheadedness  Type of dizziness: Imbalance (Disequilibrium), Spinning/Vertigo, Unsteady with head/body turns, Lightheadedness/Faint, and "World moves"  Frequency: intermittent, daily  Duration: seconds  Aggravating factors: Worse in the morning and sitting upright, bending forward  Relieving factors: head stationary  Progression of symptoms: better  OCULOMOTOR EXAM:  Ocular Alignment: normal  Ocular ROM: No Limitations  Spontaneous Nystagmus: absent  Gaze-Induced Nystagmus: absent  Smooth Pursuits: intact  Saccades: intact   VESTIBULAR - OCULAR REFLEX:   Slow VOR: Comment: has 1-2 corrections to target during 5 reps  Head-Impulse Test: HIT Right: positive HIT Left: positive    POSITIONAL TESTING: Right Dix-Hallpike: Unable to tolerate position noting she does not tolerate dentist chair Left Dix-Hallpike: Unable to tolerate Right Roll Test: mild sensation, no nystagmus Left Roll Test: negative Right Sidelying: upbeating, right nystagmus *larger amplitude nystagmus with longer duration to the right Left Sidelying: upbeating, left nystagmus x 10 second duration with subjective report of room movement  [facility] Adult PT Treatment:                                                DATE: 12/25/22  Neuromuscular re-ed: Horizontal roll test + to the L side for geotropic nystagmus lasting 35 seconds; R side + for slow, small amplitude nystagmus x 10 seconds Retested with horizontal roll after each maneuver Habituation instruction for HEP with patient performing 5 reps (1st rep=40 sec of sxs to L/R gets mild sxs, intensity remains the same with 5 reps, but duration does shorten with reps) Discussed safety for home program Canolith Repositioning: Gufoni maneuver for L HC  repositioning (R sidelying x 1 minute + nose to ground x 10mnute) Gufoni maneuver x 2nd rep   [facility]:                                                DATE: 12/23/22 Neuromuscular re-ed: L sidelying test provokes a sensation that "dizziness could come on", but it settles. When attempting to return to sitting, PT notes L beating horizontal nystagmus x 1 minute-- moved back to sidelying Rolling R provokes mild geotropic nystagmus Rolling L provokes more significant geotropic nystagmus Retested horizontal roll after BBQ and still + in L sidelying Canolith repositioning BBQ roll L side moving through prone on elbows x 1 rep Gufoni for L horizontal canlaithiasis moving onto R for sidelying x 1 minute, then nose moved to point towards floor x 152mute  and then return to sitting.  [facility] Adult PT Treatment:                                                DATE: 12/16/22 Canolith Repositioning PT initiated R epley's maneuver During 3rd position in rolling onto L shoulder, the patient had increased dizziness with horizontal (R beating nystagmus noted) --- rolled back to supine to allow nystagmus to settle, taking approximately 1 minute to subside Neuromuscular re-ed: Retested horizontal roll after noting quality and direction of nystagmus change with R epley's maneuver.  No nystagmus or reports of dizziness with retest Sat at edge of treatment table x 5 minutes before standing Self Care: Moving slowly to reduce positional symptoms, sitting after rising to ensure safe to stand  PATIENT EDUCATION: Education details: HEP Person educated: Patient Education method: Explanation Education comprehension: verbalized understanding  HOME EXERCISE PROGRAM: Access Code: [email or web address] URL: [email or web address] Date: 12/25/2022 Prepared by: Rudell Cobb  Exercises - Rolling rightleft sides for vestibular habituation  - 2 x daily - 7 x weekly - 1 sets - 5 reps  GOALS: Goals  reviewed with patient? Yes  LONG TERM GOALS: Target date: 01/13/2023    Patient will be independent with progressed HEP to improve outcomes and carryover.  Baseline: None initiated at eval Goal status: INITIAL  2.  Patient will report 75% improvement in dizziness. Baseline:  dizziness with functional tasks Goal status: INITIAL  3.    The patient will improve FOTO from 59% up to 65%.   Goal status: INITIAL  6.  The patient will have negative positional testing. Baseline: + bilat sidelying tests Goal status: INITIAL  ASSESSMENT:  CLINICAL IMPRESSION: The patient continues with BPPV, L canalithiasis today. She tolerated exercises well, however symptoms persisted after 2 rounds of Gufoni. PT added habituation HEP to begin to work on reducing symptoms at home.  OBJECTIVE IMPAIRMENTS: decreased activity tolerance, decreased balance, and dizziness.   PLAN:  PT FREQUENCY: 1-2x/week  PT DURATION: 4 weeks  PLANNED INTERVENTIONS: Therapeutic exercises, Therapeutic activity, Neuromuscular re-education, Balance training, Gait training, Patient/Family education, Self Care, Vestibular training, Canalith repositioning, and Manual therapy  PLAN FOR NEXT SESSION: reassess BPPV (all canals) and treat as indicated.    [place], PT 12/25/2022, 9:39 AM

## 2022-12-30 ENCOUNTER — Ambulatory Visit: Payer: Medicare Other

## 2022-12-30 DIAGNOSIS — H8111 Benign paroxysmal vertigo, right ear: Secondary | ICD-10-CM | POA: Diagnosis not present

## 2022-12-30 DIAGNOSIS — R42 Dizziness and giddiness: Secondary | ICD-10-CM

## 2022-12-30 DIAGNOSIS — H8112 Benign paroxysmal vertigo, left ear: Secondary | ICD-10-CM

## 2022-12-30 NOTE — Therapy (Signed)
OUTPATIENT PHYSICAL THERAPY VESTIBULAR TREATMENT  Patient Name: Heather Cox MRN: [id number] DOB:[birth year], 77 y.o., female Today's Date: 12/30/2022  END OF SESSION:  PT End of Session - 12/30/22 1102     Visit Number 4    Number of Visits 8    Date for PT Re-Evaluation 01/15/23    Authorization Type [employer]BS medicare    Progress Note Due on Visit 10    PT Start Time 1103    PT Stop Time 1145    PT Time Calculation (min) 42 min    Activity Tolerance Patient tolerated treatment well    Behavior During Therapy WFL for tasks assessed/performed             Past Medical History:  Diagnosis Date   Arthritis    GERD (gastroesophageal reflux disease)    occasionally and will take Tums if needed   History of colon polyps    benign   Hyperlipidemia    takes Atorvastatin daily   Hypertension    takes Atenolol daily   Joint pain    Past Surgical History:  Procedure Laterality Date   ABDOMINAL HYSTERECTOMY  1994   CARDIAC CATHETERIZATION  2017   03/25/16 ([facility]): Normal coronaries (EF 60-65% by echo)   cataract surgery Bilateral    CESAREAN SECTION  1977   COLONOSCOPY     COLONOSCOPY N/A 06/10/2017   Procedure: COLONOSCOPY;  Surgeon: Rogene Houston, MD;  [facility];  Service: Endoscopy;  Laterality: N/A;  [place] Right 06/08/2016   Procedure: RIGHT TOTAL KNEE ARTHROPLASTY WITH COMPUTER NAVIGATION;  Surgeon: Rod Can, MD;  Location: [place];  Service: Orthopedics;  Laterality: Right;  Request RNFA    TONSILLECTOMY     at age 43   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   Procedure: TOTAL KNEE ARTHROPLASTY with Navigation;  Surgeon: Rod Can, MD;  Location: [place];  Service: Orthopedics;  Laterality: Left;   WRIST SURGERY Left [id number]   YAG LASER APPLICATION Left [id number]   Procedure: YAG LASER APPLICATION;  Surgeon: Williams Che, MD;  [facility];  Service: Ophthalmology;  Laterality: Left;  rescheduled to 2/20    YAG LASER  APPLICATION Right [id number]   Procedure: YAG LASER APPLICATION;  Surgeon: Williams Che, MD;  [facility];  Service: Ophthalmology;  Laterality: Right;   Patient Active Problem List   Diagnosis Date Noted   Dizziness 12/14/2022   Hx of colonic polyps 02/11/2017   Osteoarthritis of left knee 11/30/2016   Degenerative arthritis of left knee 11/30/2016    PCP: Marrian Salvage, FNP REFERRING PROVIDER: Marrian Salvage, FNP  REFERRING DIAG: R42 (ICD-10-CM) - Dizziness   THERAPY DIAG:  BPPV (benign paroxysmal positional vertigo), right  BPPV (benign paroxysmal positional vertigo), left  Dizziness and giddiness  ONSET DATE: 12/12/2022  Rationale for Evaluation and Treatment: Rehabilitation  SUBJECTIVE:   SUBJECTIVE STATEMENT: Patient reports she is feeling much better today, reports she occasionally feels lightheaded but no dizziness.  EVAL: The patient reports dizziness that began over the weekend. She is avoiding bending forward and is squatting to clean out the litter box. She is getting up slowly and notices the numbers on her clock are moving. She sits on the edge of her bed until the visual jumping subsides. She rates dizziness as a 2/10 described as "lightheadedness" when at rest. The 2 episodes that were more severe are rated an 8/10. Those sensations lasted x seconds. She did have  to hold on to avoid falling. She is also noting unsteadiness since vertigo began this weekend.  PERTINENT HISTORY: Arthritis, GERD, hyperlipidemia, HTN  PAIN:  Are you having pain? Yes, some headache. Baseline level x years. Severity: 1/10, can get worse up to a 6/10 at times. Aggravating: worse in the morning Alleviating: Meds reduce pain  PRECAUTIONS: None  WEIGHT BEARING RESTRICTIONS: No  FALLS: Has patient fallen in last 6 months? No  PATIENT GOALS: reduce dizziness  OBJECTIVE:  (Measures in this section from initial evaluation unless otherwise noted)  VESTIBULAR  ASSESSMENT: SYMPTOM BEHAVIOR:  Subjective history: onset this weekend, however notes h/o of not tolerating dental chair positiion  Non-Vestibular symptoms: headaches and lightheadedness  Type of dizziness: Imbalance (Disequilibrium), Spinning/Vertigo, Unsteady with head/body turns, Lightheadedness/Faint, and "World moves"  Frequency: intermittent, daily  Duration: seconds  Aggravating factors: Worse in the morning and sitting upright, bending forward  Relieving factors: head stationary  Progression of symptoms: better  OCULOMOTOR EXAM:  Ocular Alignment: normal  Ocular ROM: No Limitations  Spontaneous Nystagmus: absent  Gaze-Induced Nystagmus: absent  Smooth Pursuits: intact  Saccades: intact   VESTIBULAR - OCULAR REFLEX:   Slow VOR: Comment: has 1-2 corrections to target during 5 reps  Head-Impulse Test: HIT Right: positive HIT Left: positive    POSITIONAL TESTING: Right Dix-Hallpike: Unable to tolerate position noting she does not tolerate dentist chair Left Dix-Hallpike: Unable to tolerate Right Roll Test: mild sensation, no nystagmus Left Roll Test: negative Right Sidelying: upbeating, right nystagmus *larger amplitude nystagmus with longer duration to the right Left Sidelying: upbeating, left nystagmus x 10 second duration with subjective report of room movement   [facility] Adult PT Treatment:                                                DATE: 12/30/2022 Neuromuscular re-ed: Horizontal Roll Test (negative findings bilateral) Dix-Hallpike (negative findings bilateral) Seated habituated forward bending x10 Seated VORx1 Y/P, metronome @ 80 bpm x30" each Fwd amb VORx1 Y/P every 3 steps 2L each Standing VORc Y/P --> fwd amb VORc Y/P every 3 steps 2L each Seated saccades: yaw/pitch/diagonals HEP habituation rolling R<->L Habituation: S/L L --> rolling R -> upright sitting x10 rounds    [facility] Adult PT Treatment:                                                DATE:  12/25/22  Neuromuscular re-ed: Horizontal roll test + to the L side for geotropic nystagmus lasting 35 seconds; R side + for slow, small amplitude nystagmus x 10 seconds Retested with horizontal roll after each maneuver Habituation instruction for HEP with patient performing 5 reps (1st rep=40 sec of sxs to L/R gets mild sxs, intensity remains the same with 5 reps, but duration does shorten with reps) Discussed safety for home program Canolith Repositioning: Gufoni maneuver for L HC repositioning (R sidelying x 1 minute + nose to ground x 39mnute) Gufoni maneuver x 2nd rep   [facility]  DATE: 12/23/22 Neuromuscular re-ed: L sidelying test provokes a sensation that "dizziness could come on", but it settles. When attempting to return to sitting, PT notes L beating horizontal nystagmus x 1 minute-- moved back to sidelying Rolling R provokes mild geotropic nystagmus Rolling L provokes more significant geotropic nystagmus Retested horizontal roll after BBQ and still + in L sidelying Canolith repositioning BBQ roll L side moving through prone on elbows x 1 rep Gufoni for L horizontal canlaithiasis moving onto R for sidelying x 1 minute, then nose moved to point towards floor x 72mnute and then return to sitting.  [facility]                                                DATE: 12/16/22 Canolith Repositioning PT initiated R epley's maneuver During 3rd position in rolling onto L shoulder, the patient had increased dizziness with horizontal (R beating nystagmus noted) --- rolled back to supine to allow nystagmus to settle, taking approximately 1 minute to subside Neuromuscular re-ed: Retested horizontal roll after noting quality and direction of nystagmus change with R epley's maneuver.  No nystagmus or reports of dizziness with retest Sat at edge of treatment table x 5 minutes before standing Self Care: Moving slowly to reduce  positional symptoms, sitting after rising to ensure safe to stand  PATIENT EDUCATION: Education details: HEP Person educated: Patient Education method: Explanation Education comprehension: verbalized understanding  HOME EXERCISE PROGRAM: Access Code: [email or web address]: [email or web address] Date: 12/30/2022 Prepared by: KHelane Gunther Exercises - Rolling rightleft sides for vestibular habituation  - 2 x daily - 7 x weekly - 1 sets - 5 reps - Walking Gaze Stabilization Head Rotation  - 1 x daily - 7 x weekly - 3 sets - 10 reps - Walking Gaze Stabilization Head Rotation with Horizontal Arm Movement  - 1 x daily - 7 x weekly - 3 sets - 10 reps - Forward Walking Horizontal VOR Cancellation  - 1 x daily - 7 x weekly - 3 sets - 10 reps  GOALS: Goals reviewed with patient? Yes  LONG TERM GOALS: Target date: 01/13/2023    Patient will be independent with progressed HEP to improve outcomes and carryover.  Baseline: None initiated at eval Goal status: INITIAL  2.  Patient will report 75% improvement in dizziness. Baseline:  dizziness with functional tasks Goal status: INITIAL  3.    The patient will improve FOTO from 59% up to 65%.   Goal status: INITIAL  6.  The patient will have negative positional testing. Baseline: + bilat sidelying tests Goal status: INITIAL  ASSESSMENT:  CLINICAL IMPRESSION: Negative findings in horizontal roll test and dix-hallpike; vestibular rehab exercises initiated as per POC. Mild increase in symptoms with VORc in pitch plane, however decreased over time. Habituated rolling performed with no exacerbation of symptoms.   OBJECTIVE IMPAIRMENTS: decreased activity tolerance, decreased balance, and dizziness.   PLAN:  PT FREQUENCY: 1-2x/week  PT DURATION: 4 weeks  PLANNED INTERVENTIONS: Therapeutic exercises, Therapeutic activity, Neuromuscular re-education, Balance training, Gait training, Patient/Family education, Self Care, Vestibular  training, Canalith repositioning, and Manual therapy  PLAN FOR NEXT SESSION: reassess BPPV (all canals) and assess response to HEP; D/C if clear.    KHardin Negus PTA 12/30/2022, 11:46 AM

## 2023-01-06 ENCOUNTER — Ambulatory Visit: Payer: Medicare Other

## 2023-01-06 DIAGNOSIS — H8112 Benign paroxysmal vertigo, left ear: Secondary | ICD-10-CM

## 2023-01-06 DIAGNOSIS — R42 Dizziness and giddiness: Secondary | ICD-10-CM

## 2023-01-06 DIAGNOSIS — H8111 Benign paroxysmal vertigo, right ear: Secondary | ICD-10-CM | POA: Diagnosis not present

## 2023-01-06 NOTE — Therapy (Signed)
OUTPATIENT PHYSICAL THERAPY VESTIBULAR TREATMENT  Patient Name: Heather Cox MRN: [id number] DOB:02-17-1946, 77 y.o., female Today's Date: 01/06/2023  END OF SESSION:  PT End of Session - 01/06/23 1533     Visit Number 5    Number of Visits 8    Date for PT Re-Evaluation 01/15/23    Authorization Type [employer] medicare    Progress Note Due on Visit 10    PT Start Time 1533    PT Stop Time 1545    PT Time Calculation (min) 12 min    Activity Tolerance Patient tolerated treatment well    Behavior During Therapy WFL for tasks assessed/performed             Past Medical History:  Diagnosis Date   Arthritis    GERD (gastroesophageal reflux disease)    occasionally and will take Tums if needed   History of colon polyps    benign   Hyperlipidemia    takes Atorvastatin daily   Hypertension    takes Atenolol daily   Joint pain    Past Surgical History:  Procedure Laterality Date   ABDOMINAL HYSTERECTOMY  1994   CARDIAC CATHETERIZATION  2017   03/25/16 ([facility]): Normal coronaries (EF 60-65% by echo)   cataract surgery Bilateral    CESAREAN SECTION  1977   COLONOSCOPY     COLONOSCOPY N/A 06/10/2017   Procedure: COLONOSCOPY;  Surgeon: Rogene Houston, MD;  [facility];  Service: Endoscopy;  Laterality: N/A;  [place] Right 06/08/2016   Procedure: RIGHT TOTAL KNEE ARTHROPLASTY WITH COMPUTER NAVIGATION;  Surgeon: Rod Can, MD;  Location: [place];  Service: Orthopedics;  Laterality: Right;  Request RNFA    TONSILLECTOMY     at age 19   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   Procedure: TOTAL KNEE ARTHROPLASTY with Navigation;  Surgeon: Rod Can, MD;  Location: [place];  Service: Orthopedics;  Laterality: Left;   WRIST SURGERY Left [id number]   YAG LASER APPLICATION Left [id number]   Procedure: YAG LASER APPLICATION;  Surgeon: Williams Che, MD;  Location: [facility];  Service: Ophthalmology;  Laterality: Left;  rescheduled to 2/20    YAG LASER  APPLICATION Right [id number]   Procedure: YAG LASER APPLICATION;  Surgeon: Williams Che, MD;  Location: [facility];  Service: Ophthalmology;  Laterality: Right;   Patient Active Problem List   Diagnosis Date Noted   Dizziness 12/14/2022   Hx of colonic polyps 02/11/2017   Osteoarthritis of left knee 11/30/2016   Degenerative arthritis of left knee 11/30/2016    PCP: Marrian Salvage, FNP REFERRING PROVIDER: Marrian Salvage, FNP  REFERRING DIAG: R42 (ICD-10-CM) - Dizziness   THERAPY DIAG:  BPPV (benign paroxysmal positional vertigo), right  BPPV (benign paroxysmal positional vertigo), left  Dizziness and giddiness  ONSET DATE: 12/12/2022  Rationale for Evaluation and Treatment: Rehabilitation  SUBJECTIVE:   SUBJECTIVE STATEMENT: Patient reports she is feeling much better today, reports she occasionally feels lightheaded but no dizziness.  EVAL: The patient reports dizziness that began over the weekend. She is avoiding bending forward and is squatting to clean out the litter box. She is getting up slowly and notices the numbers on her clock are moving. She sits on the edge of her bed until the visual jumping subsides. She rates dizziness as a 2/10 described as "lightheadedness" when at rest. The 2 episodes that were more severe are rated an 8/10. Those sensations lasted x seconds. She did have  to hold on to avoid falling. She is also noting unsteadiness since vertigo began this weekend.  PERTINENT HISTORY: Arthritis, GERD, hyperlipidemia, HTN  PAIN:  Are you having pain? Yes, some headache. Baseline level x years. Severity: 1/10, can get worse up to a 6/10 at times. Aggravating: worse in the morning Alleviating: Meds reduce pain  PRECAUTIONS: None  WEIGHT BEARING RESTRICTIONS: No  FALLS: Has patient fallen in last 6 months? No  PATIENT GOALS: reduce dizziness  OBJECTIVE:  (Measures in this section from initial evaluation unless otherwise noted)  VESTIBULAR  ASSESSMENT: SYMPTOM BEHAVIOR:  Subjective history: onset this weekend, however notes h/o of not tolerating dental chair positiion  Non-Vestibular symptoms: headaches and lightheadedness  Type of dizziness: Imbalance (Disequilibrium), Spinning/Vertigo, Unsteady with head/body turns, Lightheadedness/Faint, and "World moves"  Frequency: intermittent, daily  Duration: seconds  Aggravating factors: Worse in the morning and sitting upright, bending forward  Relieving factors: head stationary  Progression of symptoms: better  OCULOMOTOR EXAM:  Ocular Alignment: normal  Ocular ROM: No Limitations  Spontaneous Nystagmus: absent  Gaze-Induced Nystagmus: absent  Smooth Pursuits: intact  Saccades: intact   VESTIBULAR - OCULAR REFLEX:   Slow VOR: Comment: has 1-2 corrections to target during 5 reps  Head-Impulse Test: HIT Right: positive HIT Left: positive    POSITIONAL TESTING: Right Dix-Hallpike: Unable to tolerate position noting she does not tolerate dentist chair Left Dix-Hallpike: Unable to tolerate Right Roll Test: mild sensation, no nystagmus Left Roll Test: negative Right Sidelying: upbeating, right nystagmus *larger amplitude nystagmus with longer duration to the right Left Sidelying: upbeating, left nystagmus x 10 second duration with subjective report of room movement   [facility] Adult PT Treatment:                                                DATE: 01/06/2023 Neuromuscular re-ed: Re-test posterior & horizontal canals: Horizontal roll test: bilateral negative findings Dix-Hallpike: bilateral negative findings    [facility] Adult PT Treatment:                                                DATE: 12/30/2022 Neuromuscular re-ed: Horizontal Roll Test (negative findings bilateral) Dix-Hallpike (negative findings bilateral) Seated habituated forward bending x10 Seated VORx1 Y/P, metronome @ 80 bpm x30" each Fwd amb VORx1 Y/P every 3 steps 2L each Standing VORc Y/P --> fwd amb VORc Y/P  every 3 steps 2L each Seated saccades: yaw/pitch/diagonals HEP habituation rolling R<->L Habituation: S/L L --> rolling R -> upright sitting x10 rounds    [facility] Adult PT Treatment:                                                DATE: 12/25/22  Neuromuscular re-ed: Horizontal roll test + to the L side for geotropic nystagmus lasting 35 seconds; R side + for slow, small amplitude nystagmus x 10 seconds Retested with horizontal roll after each maneuver Habituation instruction for HEP with patient performing 5 reps (1st rep=40 sec of sxs to L/R gets mild sxs, intensity remains the same with 5 reps, but duration does shorten with reps)  Discussed safety for home program Canolith Repositioning: Gufoni maneuver for L HC repositioning (R sidelying x 1 minute + nose to ground x 67mnute) Gufoni maneuver x 2nd rep   [facility]                                                DATE: 12/23/22 Neuromuscular re-ed: L sidelying test provokes a sensation that "dizziness could come on", but it settles. When attempting to return to sitting, PT notes L beating horizontal nystagmus x 1 minute-- moved back to sidelying Rolling R provokes mild geotropic nystagmus Rolling L provokes more significant geotropic nystagmus Retested horizontal roll after BBQ and still + in L sidelying Canolith repositioning BBQ roll L side moving through prone on elbows x 1 rep Gufoni for L horizontal canlaithiasis moving onto R for sidelying x 1 minute, then nose moved to point towards floor x 146mute and then return to sitting.  [facility]                                                [facility]: 12[facility]16/22 Canolith Repositioning PT initiated R epley's maneuver During 3rd position in rolling onto L shoulder, the patient had increased dizziness with horizontal (R beating nystagmus noted) --- rolled back to supine to allow nystagmus to settle, taking approximately 1 minute to subside Neuromuscular re-ed: Retested  horizontal roll after noting quality and direction of nystagmus change with R epley's maneuver.  No nystagmus or reports of dizziness with retest Sat at edge of treatment table x 5 minutes before standing Self Care: Moving slowly to reduce positional symptoms, sitting after rising to ensure safe to stand  PATIENT EDUCATION: Education details: HEP Person educated: Patient Education method: Explanation Education comprehension: verbalized understanding  HOME EXERCISE PROGRAM: Access Code: [email or web address]: [email or web address] Date: 12/30/2022 Prepared by: KaHelane GuntherExercises - Rolling rightleft sides for vestibular habituation  - 2 x daily - 7 x weekly - 1 sets - 5 reps - Walking Gaze Stabilization Head Rotation  - 1 x daily - 7 x weekly - 3 sets - 10 reps - Walking Gaze Stabilization Head Rotation with Horizontal Arm Movement  - 1 x daily - 7 x weekly - 3 sets - 10 reps - Forward Walking Horizontal VOR Cancellation  - 1 x daily - 7 x weekly - 3 sets - 10 reps  GOALS: Goals reviewed with patient? Yes  LONG TERM GOALS: Target date: 01/13/2023    Patient will be independent with progressed HEP to improve outcomes and carryover.  Baseline: None initiated at eval Goal status: MET  2.  Patient will report 75% improvement in dizziness. Baseline:  dizziness with functional tasks Goal status: MET  3.    The patient will improve FOTO from 59% up to 65%.   Goal status: MET (67%)  6.  The patient will have negative positional testing. Baseline: + bilat sidelying tests Goal status: MET  ASSESSMENT:  CLINICAL IMPRESSION: Posterior and horizontal canals re-tested for BPPV; all canals clear. Patient instructed in monitoring symptoms and utilizing HEP as needed for maintenance. Patient is agreeable to discharge from PT due to being symptom-free for 2 weeks and negative findings in all canals.  OBJECTIVE IMPAIRMENTS: decreased activity tolerance, decreased balance, and  dizziness.   PLAN:  PT FREQUENCY: 1-2x/week  PT DURATION: 4 weeks  PLANNED INTERVENTIONS: Therapeutic exercises, Therapeutic activity, Neuromuscular re-education, Balance training, Gait training, Patient/Family education, Self Care, Vestibular training, Canalith repositioning, and Manual therapy  PLAN FOR NEXT SESSION: reassess BPPV (all canals) and assess response to HEP; D/C if clear.    Hardin Negus, PTA 01/06/2023, 3:50 PM

## 2023-02-08 DIAGNOSIS — K080 Exfoliation of teeth due to systemic causes: Secondary | ICD-10-CM | POA: Diagnosis not present

## 2023-02-11 DIAGNOSIS — K080 Exfoliation of teeth due to systemic causes: Secondary | ICD-10-CM | POA: Diagnosis not present

## 2023-02-17 ENCOUNTER — Telehealth: Payer: Self-pay | Admitting: Family

## 2023-02-17 NOTE — Telephone Encounter (Signed)
Contacted Heather Cox to schedule their annual wellness visit. Appointment made for 02/26/2023.  Verlee Rossetti; Care Guide Ambulatory Clinical Support [place] [facility] Direct Dial: [phone]

## 2023-03-03 DIAGNOSIS — K080 Exfoliation of teeth due to systemic causes: Secondary | ICD-10-CM | POA: Diagnosis not present

## 2023-03-16 ENCOUNTER — Ambulatory Visit (INDEPENDENT_AMBULATORY_CARE_PROVIDER_SITE_OTHER): Payer: Medicare Other | Admitting: Medical

## 2023-03-16 VITALS — BP 138/88 | HR 80 | Resp 18 | Ht 60.0 in | Wt 192.0 lb

## 2023-03-16 DIAGNOSIS — H00014 Hordeolum externum left upper eyelid: Secondary | ICD-10-CM

## 2023-03-16 DIAGNOSIS — R519 Headache, unspecified: Secondary | ICD-10-CM | POA: Diagnosis not present

## 2023-03-16 MED ORDER — TOBRAMYCIN 0.3 % OP SOLN: OPHTHALMIC | 0 refills | Status: DC

## 2023-03-16 MED ORDER — FAMCICLOVIR 500 MG PO TABS: 500.0000 mg | ORAL_TABLET | Freq: Three times a day (TID) | ORAL | 0 refills | Status: DC

## 2023-03-16 NOTE — Patient Instructions (Addendum)
1. Temporal pain Want to make sure sed rate not elevated. If elevated would  cause concern for temporal arteritis. - Sedimentation rate  2. Stye of left upper eyelid Rx tobramcyin eye drops 1-2 drops every 8 hours. Apply warm compress twice daily.  Discussed differential and your concern for shingles. Based on temporal pain and proximity to eye rx famvir antiviral. If signs/symptoms change or worsen let us know.  Follow up in 7-10 days or sooner if needed.

## 2023-03-16 NOTE — Progress Notes (Signed)
Subjective:    Patient ID: Heather Cox, female    DOB: 1946-04-29, 77 y.o.   MRN: [id number]  HPI  Pt in with left upper eye lid mild swollen. Pt has concern for shingles. Pt states she had shingles in past on Dec 31, 2020. She states was on rt eye.   Since Friday left upper eye lid mild red. No eye dc. No matting. Pt reports some temporal pain.   No matting to pt eye. No vision.    Review of Systems  Constitutional:  Negative for chills, fatigue and fever.  HENT:  Negative for congestion, drooling and ear pain.   Respiratory:  Negative for cough, chest tightness, shortness of breath and wheezing.   Cardiovascular:  Negative for chest pain and palpitations.  Gastrointestinal:  Negative for abdominal pain, blood in stool and diarrhea.  Genitourinary:  Negative for dyspareunia, dysuria and frequency.  Skin:  Negative for rash.  Neurological:  Negative for dizziness and syncope.  Hematological:  Negative for adenopathy. Does not bruise/bleed easily.  Psychiatric/Behavioral:  Negative for behavioral problems, decreased concentration and dysphoric mood.     Past Medical History:  Diagnosis Date   Arthritis    GERD (gastroesophageal reflux disease)    occasionally and will take Tums if needed   History of colon polyps    benign   Hyperlipidemia    takes Atorvastatin daily   Hypertension    takes Atenolol daily   Joint pain      Social History   Socioeconomic History   Marital status: Widowed    Spouse name: Not on file   Number of children: Not on file   Years of education: Not on file   Highest education level: Master's degree (e.g., MA, MS, MEng, MEd, MSW, MBA)  Occupational History   Not on file  Tobacco Use   Smoking status: Never   Smokeless tobacco: Never  Substance and Sexual Activity   Alcohol use: No   Drug use: No   Sexual activity: Not on file  Other Topics Concern   Not on file  Social History Narrative   Not on file   Social Determinants of  Health   Financial Resource Strain: Low Risk  (03/16/2023)   Overall Financial Resource Strain (CARDIA)    Difficulty of Paying Living Expenses: Not hard at all  Food Insecurity: No Food Insecurity (03/16/2023)   Hunger Vital Sign    Worried About Running Out of Food in the Last Year: Never true    Ran Out of Food in the Last Year: Never true  Transportation Needs: No Transportation Needs (03/16/2023)   PRAPARE - Administrator, Civil Service (Medical): No    Lack of Transportation (Non-Medical): No  Physical Activity: Unknown (03/16/2023)   Exercise Vital Sign    Days of Exercise per Week: 0 days    Minutes of Exercise per Session: Not on file  Stress: No Stress Concern Present (03/16/2023)   [employer] of Occupational Health - Occupational Stress Questionnaire    Feeling of Stress : Not at all  Social Connections: Moderately Integrated (03/16/2023)   Social Connection and Isolation Panel [NHANES]    Frequency of Communication with Friends and Family: Twice a week    Frequency of Social Gatherings with Friends and Family: Twice a week    Attends Religious Services: More than 4 times per year    Active Member of [employer] or Organizations: Yes    Attends Banker  Meetings: More than 4 times per year    Marital Status: Widowed  Intimate Partner Violence: Not At Risk (02/24/2022)   Humiliation, Afraid, Rape, and Kick questionnaire    Fear of Current or Ex-Partner: No    Emotionally Abused: No    Physically Abused: No    Sexually Abused: No    Past Surgical History:  Procedure Laterality Date   ABDOMINAL HYSTERECTOMY  1994   CARDIAC CATHETERIZATION  2017   03/25/16 ([facility]): Normal coronaries (EF 60-65% by echo)   cataract surgery Bilateral    CESAREAN SECTION  1977   COLONOSCOPY     COLONOSCOPY N/A 06/10/2017   Procedure: COLONOSCOPY;  Surgeon: Malissa Hippo, MD;  [facility];  Service: Endoscopy;  Laterality: N/A;  830   KNEE ARTHROPLASTY Right  06/08/2016   Procedure: RIGHT TOTAL KNEE ARTHROPLASTY WITH COMPUTER NAVIGATION;  Surgeon: Samson Frederic, MD;  [facility];  Service: Orthopedics;  Laterality: Right;  Request RNFA    TONSILLECTOMY     at age 8   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   Procedure: TOTAL KNEE ARTHROPLASTY with Navigation;  Surgeon: Samson Frederic, MD;  Location: [facility];  Service: Orthopedics;  Laterality: Left;   WRIST SURGERY Left 1988   YAG LASER APPLICATION Left 12/30/2015   Procedure: YAG LASER APPLICATION;  Surgeon: Susa Simmonds, MD;  Location: [facility];  Service: Ophthalmology;  Laterality: Left;  rescheduled to 2/20    YAG LASER APPLICATION Right 09/04/2016   Procedure: YAG LASER APPLICATION;  Surgeon: Susa Simmonds, MD;  Location: [facility];  Service: Ophthalmology;  Laterality: Right;    No family history on file.  Allergies  Allergen Reactions   Shellfish Allergy Anaphylaxis, Itching and Swelling    Tongue swelling, lips numb Tongue swelling, lips numb   Shrimp (Diagnostic) Itching    Mouth swelling and rash     Current Outpatient Medications on File Prior to Visit  Medication Sig Dispense Refill   amoxicillin (AMOXIL) 500 MG capsule TAKE 4 CAPSULES BY MOUTH 1-2 HOURS PRIOR TO DENTAL WORK  0   Aspirin-Caffeine 400-32 MG TABS Take 2 tablets by mouth 2 (two) times daily as needed (for pain or headache).     atenolol (TENORMIN) 25 MG tablet TAKE 1 TABLET(25 MG) BY MOUTH DAILY 90 tablet 3   atorvastatin (LIPITOR) 10 MG tablet Take 1 tablet (10 mg total) by mouth every evening. 90 tablet 3   calcium carbonate (TUMS - DOSED IN MG ELEMENTAL CALCIUM) 500 MG chewable tablet Chew 1 tablet by mouth daily as needed for indigestion or heartburn.     ibuprofen (ADVIL,MOTRIN) 200 MG tablet Take 400 mg by mouth 2 (two) times daily as needed for headache or moderate pain.     Cholecalciferol (VITAMIN D) 50 MCG (2000 UT) CAPS Take by mouth.     No current facility-administered medications on file prior  to visit.    BP 138/88   Pulse 80   Resp 18   Ht 5' (1.524 m)   Wt 192 lb (87.1 kg)   SpO2 100%   BMI 37.50 kg/m        Objective:   Physical Exam  General Mental Status- Alert. General Appearance- Not in acute distress.   Skin General: Color- Normal Color. Moisture- Normal Moisture.  Neck Carotid Arteries- Normal color. Moisture- Normal Moisture. No carotid bruits. No JVD.  Chest and Lung Exam Auscultation: Breath Sounds:-Normal.  Cardiovascular Auscultation:Rythm- Regular. Murmurs & Other Heart Sounds:Auscultation of  the heart reveals- No Murmurs.  Abdomen Inspection:-Inspeection Normal. Palpation/Percussion:Note:No mass. Palpation and Percussion of the abdomen reveal- Non Tender, Non Distended + BS, no rebound or guarding.   Neurologic Cranial Nerve exam:- CN III-XII intact(No nystagmus), symmetric smile. Strength:- 5/5 equal and symmetric strength both upper and lower extremities.   Eyes- perrl bilaterally. Left eye conjunctiva clear. Upper lid left eye mild swelling of upper lid margjin. Center portion feels like small stye. Left temporal area faint tender to palpation. No dilated veins. No vesicles.     Assessment & Plan:   / Patient Instructions  1. Temporal pain Want to make sure sed rate not elevated. If elevated cause concer for temporal arteritis. - Sedimentation rate  2. Stye of left upper eyelid Rx tobramcyin eye drops 1-2 drops every 8 hours. Apply warm compress twice daily.  Discussed differential and your concern for shingles. Based on temporal pain and proximity to eye rx famvir antiviral. If signs/symptoms change or worsen let us know.  Follow up in 7-10 days or sooner if needed.   Esperanza Richters, PA-C

## 2023-03-22 DIAGNOSIS — H524 Presbyopia: Secondary | ICD-10-CM | POA: Diagnosis not present

## 2023-03-23 ENCOUNTER — Ambulatory Visit: Payer: Medicare Other | Admitting: Family

## 2023-04-15 DIAGNOSIS — K080 Exfoliation of teeth due to systemic causes: Secondary | ICD-10-CM | POA: Diagnosis not present

## 2023-06-10 ENCOUNTER — Ambulatory Visit (INDEPENDENT_AMBULATORY_CARE_PROVIDER_SITE_OTHER): Payer: Medicare Other | Admitting: *Deleted

## 2023-06-10 VITALS — Ht 60.0 in | Wt 192.0 lb

## 2023-06-10 DIAGNOSIS — Z0000 Encounter for general adult medical examination without abnormal findings: Secondary | ICD-10-CM

## 2023-06-10 NOTE — Patient Instructions (Signed)
Ms. Heather Cox , Thank you for taking time to come for your Medicare Wellness Visit. I appreciate your ongoing commitment to your health goals. Please review the following plan we discussed and let me know if I can assist you in the future.     This is a list of the screening recommended for you and due dates:  Health Maintenance  Topic Date Due   Hepatitis C Screening  Never done   Flu Shot  06/10/2023   Medicare Annual Wellness Visit  06/09/2024   DTaP/Tdap/Td vaccine (2 - Td or Tdap) 04/05/2031   DEXA scan (bone density measurement)  Completed   HPV Vaccine  Aged Out   Pneumonia Vaccine  Discontinued   COVID-19 Vaccine  Discontinued   Zoster (Shingles) Vaccine  Discontinued    Next appointment: Follow up in one year for your annual wellness visit.   Preventive Care 79 Years and Older, Female Preventive care refers to lifestyle choices and visits with your health care provider that can promote health and wellness. What does preventive care include? A yearly physical exam. This is also called an annual well check. Dental exams once or twice a year. Routine eye exams. Ask your health care provider how often you should have your eyes checked. Personal lifestyle choices, including: Daily care of your teeth and gums. Regular physical activity. Eating a healthy diet. Avoiding tobacco and drug use. Limiting alcohol use. Practicing safe sex. Taking low-dose aspirin every day. Taking vitamin and mineral supplements as recommended by your health care provider. What happens during an annual well check? The services and screenings done by your health care provider during your annual well check will depend on your age, overall health, lifestyle risk factors, and family history of disease. Counseling  Your health care provider may ask you questions about your: Alcohol use. Tobacco use. Drug use. Emotional well-being. Home and relationship well-being. Sexual activity. Eating  habits. History of falls. Memory and ability to understand (cognition). Work and work Astronomer. Reproductive health. Screening  You may have the following tests or measurements: Height, weight, and BMI. Blood pressure. Lipid and cholesterol levels. These may be checked every 5 years, or more frequently if you are over 20 years old. Skin check. Lung cancer screening. You may have this screening every year starting at age 74 if you have a 30-pack-year history of smoking and currently smoke or have quit within the past 15 years. Fecal occult blood test (FOBT) of the stool. You may have this test every year starting at age 54. Flexible sigmoidoscopy or colonoscopy. You may have a sigmoidoscopy every 5 years or a colonoscopy every 10 years starting at age 10. Hepatitis C blood test. Hepatitis B blood test. Sexually transmitted disease (STD) testing. Diabetes screening. This is done by checking your blood sugar (glucose) after you have not eaten for a while (fasting). You may have this done every 1-3 years. Bone density scan. This is done to screen for osteoporosis. You may have this done starting at age 81. Mammogram. This may be done every 1-2 years. Talk to your health care provider about how often you should have regular mammograms. Talk with your health care provider about your test results, treatment options, and if necessary, the need for more tests. Vaccines  Your health care provider may recommend certain vaccines, such as: Influenza vaccine. This is recommended every year. Tetanus, diphtheria, and acellular pertussis (Tdap, Td) vaccine. You may need a Td booster every 10 years. Zoster vaccine. You may need  this after age 43. Pneumococcal 13-valent conjugate (PCV13) vaccine. One dose is recommended after age 27. Pneumococcal polysaccharide (PPSV23) vaccine. One dose is recommended after age 58. Talk to your health care provider about which screenings and vaccines you need and how  often you need them. This information is not intended to replace advice given to you by your health care provider. Make sure you discuss any questions you have with your health care provider. Document Released: 11/22/2015 Document Revised: 07/15/2016 Document Reviewed: 08/27/2015 Elsevier Interactive Patient Education  2017 [employer].  Fall Prevention in the Home Falls can cause injuries. They can happen to people of all ages. There are many things you can do to make your home safe and to help prevent falls. What can I do on the outside of my home? Regularly fix the edges of walkways and driveways and fix any cracks. Remove anything that might make you trip as you walk through a door, such as a raised step or threshold. Trim any bushes or trees on the path to your home. Use bright outdoor lighting. Clear any walking paths of anything that might make someone trip, such as rocks or tools. Regularly check to see if handrails are loose or broken. Make sure that both sides of any steps have handrails. Any raised decks and porches should have guardrails on the edges. Have any leaves, snow, or ice cleared regularly. Use sand or salt on walking paths during winter. Clean up any spills in your garage right away. This includes oil or grease spills. What can I do in the bathroom? Use night lights. Install grab bars by the toilet and in the tub and shower. Do not use towel bars as grab bars. Use non-skid mats or decals in the tub or shower. If you need to sit down in the shower, use a plastic, non-slip stool. Keep the floor dry. Clean up any water that spills on the floor as soon as it happens. Remove soap buildup in the tub or shower regularly. Attach bath mats securely with double-sided non-slip rug tape. Do not have throw rugs and other things on the floor that can make you trip. What can I do in the bedroom? Use night lights. Make sure that you have a light by your bed that is easy to  reach. Do not use any sheets or blankets that are too big for your bed. They should not hang down onto the floor. Have a firm chair that has side arms. You can use this for support while you get dressed. Do not have throw rugs and other things on the floor that can make you trip. What can I do in the kitchen? Clean up any spills right away. Avoid walking on wet floors. Keep items that you use a lot in easy-to-reach places. If you need to reach something above you, use a strong step stool that has a grab bar. Keep electrical cords out of the way. Do not use floor polish or wax that makes floors slippery. If you must use wax, use non-skid floor wax. Do not have throw rugs and other things on the floor that can make you trip. What can I do with my stairs? Do not leave any items on the stairs. Make sure that there are handrails on both sides of the stairs and use them. Fix handrails that are broken or loose. Make sure that handrails are as long as the stairways. Check any carpeting to make sure that it is firmly attached to  the stairs. Fix any carpet that is loose or worn. Avoid having throw rugs at the top or bottom of the stairs. If you do have throw rugs, attach them to the floor with carpet tape. Make sure that you have a light switch at the top of the stairs and the bottom of the stairs. If you do not have them, ask someone to add them for you. What else can I do to help prevent falls? Wear shoes that: Do not have high heels. Have rubber bottoms. Are comfortable and fit you well. Are closed at the toe. Do not wear sandals. If you use a stepladder: Make sure that it is fully opened. Do not climb a closed stepladder. Make sure that both sides of the stepladder are locked into place. Ask someone to hold it for you, if possible. Clearly mark and make sure that you can see: Any grab bars or handrails. First and last steps. Where the edge of each step is. Use tools that help you move  around (mobility aids) if they are needed. These include: Canes. Walkers. Scooters. Crutches. Turn on the lights when you go into a dark area. Replace any light bulbs as soon as they burn out. Set up your furniture so you have a clear path. Avoid moving your furniture around. If any of your floors are uneven, fix them. If there are any pets around you, be aware of where they are. Review your medicines with your doctor. Some medicines can make you feel dizzy. This can increase your chance of falling. Ask your doctor what other things that you can do to help prevent falls. This information is not intended to replace advice given to you by your health care provider. Make sure you discuss any questions you have with your health care provider. Document Released: 08/22/2009 Document Revised: 04/02/2016 Document Reviewed: 11/30/2014 Elsevier Interactive Patient Education  2017 [employer].

## 2023-06-10 NOTE — Progress Notes (Addendum)
Subjective:   Heather Cox is a 77 y.o. female who presents for Medicare Annual (Subsequent) preventive examination.  Visit Complete: Virtual  I connected with  Heather Cox on 06/10/23 by a audio enabled telemedicine application and verified that I am speaking with the correct person using two identifiers.  Patient Location: Home  Provider Location: Office/Clinic  I discussed the limitations of evaluation and management by telemedicine. The patient expressed understanding and agreed to proceed.  Patient Medicare AWV questionnaire was completed by the patient on 06/10/23; I have confirmed that all information answered by patient is correct and no changes since this date.  Review of Systems     Cardiac Risk Factors include: advanced age (>2men, >63 women);dyslipidemia;hypertension;obesity (BMI >30kg/m2)     Objective:    Today's Vitals   06/10/23 1028  Weight: 192 lb (87.1 kg)  Height: 5' (1.524 m)   Body mass index is 37.5 kg/m.  Vital Signs: Patient was unable to self-report vital signs via telehealth due to a lack of equipment at home.      06/10/2023   10:23 AM 12/16/2022   11:13 AM 02/24/2022    2:36 PM 07/02/2021   11:45 AM 12/13/2020   10:42 AM 06/10/2017    7:19 AM 11/19/2016    2:24 PM  Advanced Directives  Does Patient Have a Medical Advance Directive? Yes Yes Yes No Yes Yes Yes  Type of Estate agent of [place];Living will Healthcare Power of [place];Living will Healthcare Power of [place];Living will  Healthcare Power of [place];Living will Healthcare Power of [place];Living will Healthcare Power of Attorney  Does patient want to make changes to medical advance directive? No - Patient declined      No - Patient declined  Copy of Healthcare Power of Attorney in Chart? No - copy requested  No - copy requested   No - copy requested Yes  Would patient like information on creating a medical advance directive?    No - Patient declined No -  Patient declined      Current Medications (verified) Outpatient Encounter Medications as of 06/10/2023  Medication Sig   Cholecalciferol (VITAMIN D3) 50 MCG (2000 UT) CAPS Take by mouth.   amoxicillin (AMOXIL) 500 MG capsule TAKE 4 CAPSULES BY MOUTH 1-2 HOURS PRIOR TO DENTAL WORK   Aspirin-Caffeine 400-32 MG TABS Take 2 tablets by mouth 2 (two) times daily as needed (for pain or headache).   atenolol (TENORMIN) 25 MG tablet TAKE 1 TABLET(25 MG) BY MOUTH DAILY   atorvastatin (LIPITOR) 10 MG tablet Take 1 tablet (10 mg total) by mouth every evening.   calcium carbonate (TUMS - DOSED IN MG ELEMENTAL CALCIUM) 500 MG chewable tablet Chew 1 tablet by mouth daily as needed for indigestion or heartburn.   ibuprofen (ADVIL,MOTRIN) 200 MG tablet Take 400 mg by mouth 2 (two) times daily as needed for headache or moderate pain.   [DISCONTINUED] Cholecalciferol (VITAMIN D) 50 MCG (2000 UT) CAPS Take by mouth.   [DISCONTINUED] famciclovir (FAMVIR) 500 MG tablet Take 1 tablet (500 mg total) by mouth 3 (three) times daily.   [DISCONTINUED] tobramycin (TOBREX) 0.3 % ophthalmic solution 1-2 drops to eye every 8 hours.   No facility-administered encounter medications on file as of 06/10/2023.    Allergies (verified) Shellfish allergy and Shrimp (diagnostic)   History: Past Medical History:  Diagnosis Date   Allergy 2000   Arthritis    Cataract 1995   GERD (gastroesophageal reflux disease)  occasionally and will take Tums if needed   History of colon polyps    benign   Hyperlipidemia    takes Atorvastatin daily   Hypertension    takes Atenolol daily   Joint pain    Past Surgical History:  Procedure Laterality Date   ABDOMINAL HYSTERECTOMY  1994   CARDIAC CATHETERIZATION  2017   03/25/16 ([facility]): Normal coronaries (EF 60-65% by echo)   cataract surgery Bilateral    CESAREAN SECTION  1977   COLONOSCOPY     COLONOSCOPY N/A 06/10/2017   Procedure: COLONOSCOPY;  Surgeon: Malissa Hippo, MD;   Location: [facility];  Service: Endoscopy;  Laterality: N/A;  830   EYE SURGERY  1995   JOINT REPLACEMENT  2017   KNEE ARTHROPLASTY Right 06/08/2016   Procedure: RIGHT TOTAL KNEE ARTHROPLASTY WITH COMPUTER NAVIGATION;  Surgeon: Samson Frederic, MD;  Location: [facility] OR;  Service: Orthopedics;  Laterality: Right;  Request RNFA    TONSILLECTOMY     at age 8   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 No pertinent family history. Social History   Socioeconomic History   Marital status: Widowed    Spouse name: Not on file   Number of children: Not on file   Years of education: Not on file   Highest education level: Master's degree (e.g., MA, MS, MEng, MEd, MSW, MBA)  Occupational History   Not on file  Tobacco Use   Smoking status: Never   Smokeless tobacco: Never  Substance and Sexual Activity   Alcohol use: No   Drug use: No   Sexual activity: Not Currently    Birth control/protection: None  Other Topics Concern   Not on file  Social History Narrative   Not on file   Social Determinants of Health   Financial Resource Strain: Low Risk  (06/10/2023)   Overall Financial Resource Strain (CARDIA)    Difficulty of Paying Living Expenses: Not hard at all  Food Insecurity: No Food Insecurity (06/10/2023)   Hunger Vital Sign    Worried About Running Out of Food in the Last Year: Never true    Ran Out of Food in the Last Year: Never true   Transportation Needs: No Transportation Needs (06/10/2023)   PRAPARE - Administrator, Civil Service (Medical): No    Lack of Transportation (Non-Medical): No  Physical Activity: Inactive (06/10/2023)   Exercise Vital Sign    Days of Exercise per Week: 0 days    Minutes of Exercise per Session: 0 min  Stress: No Stress Concern Present (06/10/2023)   [employer] of Occupational Health - Occupational Stress Questionnaire    Feeling of Stress : Not at all  Social Connections: Moderately Integrated (06/10/2023)   Social Connection and Isolation Panel [NHANES]    Frequency of Communication with Friends and Family: Twice a week    Frequency of Social Gatherings with Friends and Family: Twice a week  Attends Religious Services: More than 4 times per year    Active Member of Clubs or Organizations: Yes    Attends Banker Meetings: More than 4 times per year    Marital Status: Widowed    Tobacco Counseling Counseling given: Not Answered   Clinical Intake:  Pre-visit preparation completed: Yes  Pain : No/denies pain  BMI - recorded: 37.5 Nutritional Status: BMI > 30  Obese Nutritional Risks: None Diabetes: No  How often do you need to have someone help you when you read instructions, pamphlets, or other written materials from your doctor or pharmacy?: 1 - Never  Interpreter Needed?: No  Information entered by :: Donne Anon, CMA   Activities of Daily Living    06/10/2023    8:00 AM  In your present state of health, do you have any difficulty performing the following activities:  Hearing? 0  Vision? 0  Difficulty concentrating or making decisions? 0  Walking or climbing stairs? 0  Dressing or bathing? 0  Doing errands, shopping? 0  Preparing Food and eating ? N  Using the Toilet? N  In the past six months, have you accidently leaked urine? N  Do you have problems with loss of bowel control? N  Managing your Medications? N  Managing your  Finances? N  Housekeeping or managing your Housekeeping? N    Patient Care Team: Olive Bass, FNP as PCP - General (Internal Medicine)  Indicate any recent Medical Services you may have received from other than [facility] providers in the past year (date may be approximate).     Assessment:   This is a routine wellness examination for Heather Cox.  Hearing/Vision screen No results found.  Dietary issues and exercise activities discussed:     Goals Addressed   None    Depression Screen    06/10/2023   10:27 AM 09/17/2022   10:39 AM 02/24/2022    2:36 PM 09/12/2021   10:36 AM  PHQ 2/9 Scores  PHQ - 2 Score 0 0 0 0    Fall Risk    06/10/2023    8:00 AM 09/17/2022   10:39 AM 02/24/2022    2:38 PM 09/12/2021   10:36 AM 04/04/2021    2:04 PM  Fall Risk   Falls in the past year? 0 0 0 1 0  Number falls in past yr: 0 0 0 0 0  Injury with Fall? 0 0 0 1 0  Risk for fall due to : No Fall Risks  Impaired vision Impaired balance/gait No Fall Risks  Follow up Falls evaluation completed Falls evaluation completed Falls prevention discussed Falls evaluation completed Falls evaluation completed    MEDICARE RISK AT HOME:  Medicare Risk at Home - 06/10/23 0800     If so, are there any without handrails? No             TIMED UP AND GO:  Was the test performed?  No    Cognitive Function:        06/10/2023   10:28 AM 02/24/2022    2:39 PM  6CIT Screen  What Year? 0 points 0 points  What month? 0 points 0 points  What time? 0 points 0 points  Count back from 20 0 points 0 points  Months in reverse 0 points 0 points  Repeat phrase 0 points 0 points  Total Score 0 points 0 points    Immunizations Immunization History  Administered Date(s) Administered   Fluad Quad(high Dose  65+) 09/17/2022   Influenza-Unspecified 09/09/2020   Janssen (J&J) SARS-COV-2 Vaccination 02/12/2020   Moderna SARS-COV2 Booster Vaccination 05/15/2021   Pneumococcal Polysaccharide-23 04/26/2014    Tdap 04/04/2021    TDAP status: Up to date  Flu Vaccine status: Due, Education has been provided regarding the importance of this vaccine. Advised may receive this vaccine at local pharmacy or Health Dept. Aware to provide a copy of the vaccination record if obtained from local pharmacy or Health Dept. Verbalized acceptance and understanding.  Pneumococcal vaccine status: Up to date  Covid-19 vaccine status: Information provided on how to obtain vaccines.   Qualifies for Shingles Vaccine? Yes   Zostavax completed No   Shingrix Completed?: No.    Education has been provided regarding the importance of this vaccine. Patient has been advised to call insurance company to determine out of pocket expense if they have not yet received this vaccine. Advised may also receive vaccine at local pharmacy or Health Dept. Verbalized acceptance and understanding.  Screening Tests Health Maintenance  Topic Date Due   Hepatitis C Screening  Never done   INFLUENZA VACCINE  06/10/2023   Medicare Annual Wellness (AWV)  06/09/2024   DTaP/Tdap/Td (2 - Td or Tdap) 04/05/2031   DEXA SCAN  Completed   HPV VACCINES  Aged Out   Pneumonia Vaccine 69+ Years old  Discontinued   COVID-19 Vaccine  Discontinued   Zoster Vaccines- Shingrix  Discontinued    Health Maintenance  Health Maintenance Due  Topic Date Due   Hepatitis C Screening  Never done   INFLUENZA VACCINE  06/10/2023    Colorectal cancer screening: No longer required.   Mammogram status: Completed 10/28/21. Repeat every year  Bone Density status: Completed 10/28/21. Results reflect: Bone density results: OSTEOPENIA. Repeat every 2 years.  Lung Cancer Screening: (Low Dose CT Chest recommended if Age 11-80 years, 20 pack-year currently smoking OR have quit w/in 15years.) does not qualify.   Additional Screening:  Hepatitis C Screening: does qualify; Completed N/a  Vision Screening: Recommended annual ophthalmology exams for early  detection of glaucoma and other disorders of the eye. Is the patient up to date with their annual eye exam?  Yes  Who is the provider or what is the name of the office in which the patient attends annual eye exams? Dr. Youlanda Roys and Dr. Terrilee Croak (retina specialist) If pt is not established with a provider, would they like to be referred to a provider to establish care? No .   Dental Screening: Recommended annual dental exams for proper oral hygiene  Diabetic Foot Exam: N/a  Community Resource Referral / Chronic Care Management: CRR required this visit?  No   CCM required this visit?  No     Plan:     I have personally reviewed and noted the following in the patient's chart:   Medical and social history Use of alcohol, tobacco or illicit drugs  Current medications and supplements including opioid prescriptions. Patient is not currently taking opioid prescriptions. Functional ability and status Nutritional status Physical activity Advanced directives List of other physicians Hospitalizations, surgeries, and ER visits in previous 12 months Vitals Screenings to include cognitive, depression, and falls Referrals and appointments  In addition, I have reviewed and discussed with patient certain preventive protocols, quality metrics, and best practice recommendations. A written personalized care plan for preventive services as well as general preventive health recommendations were provided to patient.     Donne Anon, CMA   06/10/2023   After  Visit Summary: (MyChart) Due to this being a telephonic visit, the after visit summary with patients personalized plan was offered to patient via MyChart   Nurse Notes: None

## 2023-06-24 DIAGNOSIS — K080 Exfoliation of teeth due to systemic causes: Secondary | ICD-10-CM | POA: Diagnosis not present

## 2023-07-06 DIAGNOSIS — H35372 Puckering of macula, left eye: Secondary | ICD-10-CM | POA: Diagnosis not present

## 2023-07-06 DIAGNOSIS — H43813 Vitreous degeneration, bilateral: Secondary | ICD-10-CM | POA: Diagnosis not present

## 2023-07-20 DIAGNOSIS — H903 Sensorineural hearing loss, bilateral: Secondary | ICD-10-CM | POA: Diagnosis not present

## 2023-08-17 ENCOUNTER — Other Ambulatory Visit: Payer: Self-pay | Admitting: Family

## 2023-08-17 DIAGNOSIS — M1612 Unilateral primary osteoarthritis, left hip: Secondary | ICD-10-CM | POA: Diagnosis not present

## 2023-08-25 DIAGNOSIS — M25552 Pain in left hip: Secondary | ICD-10-CM | POA: Diagnosis not present

## 2023-09-30 ENCOUNTER — Encounter: Payer: Self-pay | Admitting: Family

## 2023-09-30 ENCOUNTER — Ambulatory Visit: Payer: Medicare Other | Admitting: Family

## 2023-09-30 VITALS — BP 114/76 | HR 67 | Resp 18 | Ht 60.0 in | Wt 196.4 lb

## 2023-09-30 DIAGNOSIS — R7309 Other abnormal glucose: Secondary | ICD-10-CM

## 2023-09-30 DIAGNOSIS — Z0000 Encounter for general adult medical examination without abnormal findings: Secondary | ICD-10-CM | POA: Diagnosis not present

## 2023-09-30 DIAGNOSIS — E785 Hyperlipidemia, unspecified: Secondary | ICD-10-CM | POA: Diagnosis not present

## 2023-09-30 DIAGNOSIS — Z1159 Encounter for screening for other viral diseases: Secondary | ICD-10-CM | POA: Diagnosis not present

## 2023-09-30 DIAGNOSIS — E2839 Other primary ovarian failure: Secondary | ICD-10-CM

## 2023-09-30 DIAGNOSIS — Z1231 Encounter for screening mammogram for malignant neoplasm of breast: Secondary | ICD-10-CM

## 2023-09-30 DIAGNOSIS — Z13820 Encounter for screening for osteoporosis: Secondary | ICD-10-CM

## 2023-09-30 DIAGNOSIS — E559 Vitamin D deficiency, unspecified: Secondary | ICD-10-CM | POA: Diagnosis not present

## 2023-09-30 DIAGNOSIS — Z23 Encounter for immunization: Secondary | ICD-10-CM | POA: Diagnosis not present

## 2023-09-30 LAB — LIPID PANEL
Cholesterol: 161 mg/dL (ref 0–200)
HDL: 56.3 mg/dL (ref >39.00)
LDL Cholesterol: 73 mg/dL (ref 0–99)
NonHDL: 105.07
Total CHOL/HDL Ratio: 3
Triglycerides: 162 mg/dL — ABNORMAL HIGH (ref 0.0–149.0)
VLDL: 32.4 mg/dL (ref 0.0–40.0)

## 2023-09-30 LAB — CBC WITH DIFFERENTIAL/PLATELET
Basophils Absolute: 0 10*3/uL (ref 0.0–0.1)
Basophils Relative: 0.7 % (ref 0.0–3.0)
Eosinophils Absolute: 0.1 10*3/uL (ref 0.0–0.7)
Eosinophils Relative: 1.8 % (ref 0.0–5.0)
HCT: 42.6 % (ref 36.0–46.0)
Hemoglobin: 13.5 g/dL (ref 12.0–15.0)
Lymphocytes Relative: 23.2 % (ref 12.0–46.0)
Lymphs Abs: 1.6 10*3/uL (ref 0.7–4.0)
MCHC: 31.8 g/dL (ref 30.0–36.0)
MCV: 91 fL (ref 78.0–100.0)
Monocytes Absolute: 0.6 10*3/uL (ref 0.1–1.0)
Monocytes Relative: 8.9 % (ref 3.0–12.0)
Neutro Abs: 4.4 10*3/uL (ref 1.4–7.7)
Neutrophils Relative %: 65.4 % (ref 43.0–77.0)
Platelets: 253 10*3/uL (ref 150.0–400.0)
RBC: 4.68 Mil/uL (ref 3.87–5.11)
RDW: 14.2 % (ref 11.5–15.5)
WBC: 6.7 10*3/uL (ref 4.0–10.5)

## 2023-09-30 LAB — COMPREHENSIVE METABOLIC PANEL
ALT: 14 U/L (ref 0–35)
AST: 22 U/L (ref 0–37)
Albumin: 4.3 g/dL (ref 3.5–5.2)
Alkaline Phosphatase: 77 U/L (ref 39–117)
BUN: 19 mg/dL (ref 6–23)
CO2: 27 meq/L (ref 19–32)
Calcium: 9.6 mg/dL (ref 8.4–10.5)
Chloride: 104 meq/L (ref 96–112)
Creatinine, Ser: 1.05 mg/dL (ref 0.40–1.20)
GFR: 50.4 mL/min — ABNORMAL LOW (ref >60.00)
Glucose, Bld: 85 mg/dL (ref 70–99)
Potassium: 4.3 meq/L (ref 3.5–5.1)
Sodium: 140 meq/L (ref 135–145)
Total Bilirubin: 0.5 mg/dL (ref 0.2–1.2)
Total Protein: 6.8 g/dL (ref 6.0–8.3)

## 2023-09-30 LAB — HEMOGLOBIN A1C: Hgb A1c MFr Bld: 5.3 % (ref 4.6–6.5)

## 2023-09-30 LAB — HEPATITIS C ANTIBODY: Hepatitis C Ab: NONREACTIVE

## 2023-09-30 LAB — VITAMIN D 25 HYDROXY (VIT D DEFICIENCY, FRACTURES): VITD: 31.76 ng/mL (ref 30.00–100.00)

## 2023-09-30 MED ORDER — ATENOLOL 25 MG PO TABS: 25.0000 mg | ORAL_TABLET | Freq: Every day | ORAL | 3 refills | Status: DC

## 2023-09-30 NOTE — Progress Notes (Signed)
Heather Cox is a 77 y.o. female with the following history as recorded in Epic[employer]:  Patient Active Problem List   Diagnosis Date Noted   Dizziness 12/14/2022   Hx of colonic polyps 02/11/2017   Osteoarthritis of left knee 11/30/2016   Degenerative arthritis of left knee 11/30/2016    Current Outpatient Medications  Medication Sig Dispense Refill   amoxicillin (AMOXIL) 500 MG capsule TAKE 4 CAPSULES BY MOUTH 1-2 HOURS PRIOR TO DENTAL WORK  0   Aspirin-Caffeine 400-32 MG TABS Take 2 tablets by mouth 2 (two) times daily as needed (for pain or headache).     atorvastatin (LIPITOR) 10 MG tablet TAKE 1 TABLET(10 MG) BY MOUTH EVERY EVENING 90 tablet 3   calcium carbonate (TUMS - DOSED IN MG ELEMENTAL CALCIUM) 500 MG chewable tablet Chew 1 tablet by mouth daily as needed for indigestion or heartburn.     Cholecalciferol (VITAMIN D3) 50 MCG (2000 UT) CAPS Take by mouth.     ibuprofen (ADVIL,MOTRIN) 200 MG tablet Take 400 mg by mouth 2 (two) times daily as needed for headache or moderate pain.     atenolol (TENORMIN) 25 MG tablet Take 1 tablet (25 mg total) by mouth daily. 90 tablet 3   No current facility-administered medications for this visit.    Allergies: Shellfish allergy and Shrimp (diagnostic)  Past Medical History:  Diagnosis Date   Allergy 2000   Arthritis    Cataract 1995   GERD (gastroesophageal reflux disease)    occasionally and will take Tums if needed   History of colon polyps    benign   Hyperlipidemia    takes Atorvastatin daily   Hypertension    takes Atenolol daily   Joint pain     Past Surgical History:  Procedure Laterality Date   ABDOMINAL HYSTERECTOMY  1994   CARDIAC CATHETERIZATION  2017   03/25/16 ([facility]): Normal coronaries (EF 60-65% by echo)   cataract surgery Bilateral    CESAREAN SECTION  1977   COLONOSCOPY     COLONOSCOPY N/A 06/10/2017   Procedure: COLONOSCOPY;  Surgeon: Malissa Hippo, MD;  [facility]: [facility];  Service: Endoscopy;   Laterality: N/A;  830   EYE SURGERY  1995   JOINT REPLACEMENT  2017   KNEE ARTHROPLASTY Right 06/08/2016   Procedure: RIGHT TOTAL KNEE ARTHROPLASTY WITH COMPUTER NAVIGATION;  Surgeon: Samson Frederic, MD;  Location: [facility];  Service: Orthopedics;  Laterality: Right;  Request RNFA    TONSILLECTOMY     at age 85   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   Procedure: TOTAL KNEE ARTHROPLASTY with Navigation;  Surgeon: Samson Frederic, MD;  Location: [facility];  Service: Orthopedics;  Laterality: Left;   WRIST SURGERY Left 1988   YAG LASER APPLICATION Left 12/30/2015   Procedure: YAG LASER APPLICATION;  Surgeon: Susa Simmonds, MD;  Location: [facility];  Service: Ophthalmology;  Laterality: Left;  rescheduled to 2/20    YAG LASER APPLICATION Right 09/04/2016   Procedure: YAG LASER APPLICATION;  Surgeon: Susa Simmonds, MD;  Location: [facility];  Service: Ophthalmology;  Laterality: Right;    No family history on file.  Social History   Tobacco Use   Smoking status: Never   Smokeless tobacco: Never  Substance Use Topics   Alcohol use: No    Subjective:   Presents for yearly CPE; no acute concerns- would like flu shot; Orthopedist- prn for follow up on left hip; ENT for hearing aids for 6 months;  Dr.  Marisue Humble- ophthalmology yearly; [facility]   Review of Systems  Constitutional: Negative.   HENT: Negative.    Eyes: Negative.   Respiratory: Negative.    Cardiovascular: Negative.   Gastrointestinal: Negative.   Genitourinary: Negative.   Musculoskeletal: Negative.   Skin: Negative.   Neurological: Negative.   Endo/Heme/Allergies: Negative.   Psychiatric/Behavioral: Negative.        Objective:  Vitals:   09/30/23 0959  BP: 114/76  Pulse: 67  Resp: 18  SpO2: 96%  Weight: 196 lb 6.4 oz (89.1 kg)  Height: 5' (1.524 m)    General: Well developed, well nourished, in no acute distress  Skin : Warm and dry. SK noted on upper left cheek Head: Normocephalic and  atraumatic  Eyes: Sclera and conjunctiva clear; pupils round and reactive to light; extraocular movements intact  Ears: External normal; canals clear; tympanic membranes normal  Oropharynx: Pink, supple. No suspicious lesions  Neck: Supple without thyromegaly, adenopathy  Lungs: Respirations unlabored; clear to auscultation bilaterally without wheeze, rales, rhonchi  CVS exam: normal rate and regular rhythm.  Musculoskeletal: No deformities; no active joint inflammation  Extremities: No edema, cyanosis, clubbing  Vessels: Symmetric bilaterally  Neurologic: Alert and oriented; speech intact; face symmetrical; moves all extremities well; CNII-XII intact without focal deficit   Assessment:  1. PE (physical exam), annual   2. Visit for screening mammogram   3. Ovarian failure   4. Osteoporosis screening   5. Hyperlipidemia, unspecified hyperlipidemia type   6. Elevated glucose   7. Vitamin D deficiency   8. Need for hepatitis C screening test   9. Need for influenza vaccination     Plan:  Age appropriate preventive healthcare needs addressed; encouraged regular eye doctor and dental exams; encouraged regular exercise; will update labs and refills as needed today; follow-up to be determined; Mammogram and DEXA order updated; patient prefers to do mammogram every 2 years; Flu shot updated;   Return in about 1 year (around 09/29/2024) for CPE.  Orders Placed This Encounter  Procedures   MM Digital Screening    Standing Status:   Future    Standing Expiration Date:   09/29/2024    Order Specific Question:   Reason for Exam (SYMPTOM  OR DIAGNOSIS REQUIRED)    Answer:   screening mammogram    Order Specific Question:   Preferred imaging location?    Answer:   [facility]   DG Bone Density    Standing Status:   Future    Standing Expiration Date:   09/29/2024    Scheduling Instructions:     Please schedule with mammogram in December 2024    Order Specific Question:   Reason  for Exam (SYMPTOM  OR DIAGNOSIS REQUIRED)    Answer:   ovarian failure/ osteoporosis screening    Order Specific Question:   Preferred imaging location?    Answer:   [facility]   Flu Vaccine Trivalent High Dose (Fluad)   CBC with Differential/Platelet   Comp Met (CMET)   Lipid panel   Hemoglobin A1c   Vitamin D (25 hydroxy)   Hepatitis C Antibody    Requested Prescriptions   Signed Prescriptions Disp Refills   atenolol (TENORMIN) 25 MG tablet 90 tablet 3    Sig: Take 1 tablet (25 mg total) by mouth daily.

## 2023-10-26 ENCOUNTER — Other Ambulatory Visit (HOSPITAL_BASED_OUTPATIENT_CLINIC_OR_DEPARTMENT_OTHER): Payer: Medicare Other

## 2023-10-26 ENCOUNTER — Ambulatory Visit (HOSPITAL_BASED_OUTPATIENT_CLINIC_OR_DEPARTMENT_OTHER): Payer: Medicare Other

## 2023-11-16 ENCOUNTER — Ambulatory Visit (HOSPITAL_BASED_OUTPATIENT_CLINIC_OR_DEPARTMENT_OTHER)
Admission: RE | Admit: 2023-11-16 | Discharge: 2023-11-16 | Disposition: A | Discharge status: Home / Self Care | Payer: Medicare Other | Source: Ambulatory Visit | Attending: Family | Admitting: Family

## 2023-11-16 ENCOUNTER — Encounter (HOSPITAL_BASED_OUTPATIENT_CLINIC_OR_DEPARTMENT_OTHER): Payer: Self-pay

## 2023-11-16 DIAGNOSIS — M85852 Other specified disorders of bone density and structure, left thigh: Secondary | ICD-10-CM | POA: Diagnosis not present

## 2023-11-16 DIAGNOSIS — Z1231 Encounter for screening mammogram for malignant neoplasm of breast: Secondary | ICD-10-CM | POA: Insufficient documentation

## 2023-11-16 DIAGNOSIS — E2839 Other primary ovarian failure: Secondary | ICD-10-CM | POA: Insufficient documentation

## 2023-11-16 DIAGNOSIS — Z13820 Encounter for screening for osteoporosis: Secondary | ICD-10-CM | POA: Insufficient documentation

## 2023-12-27 DIAGNOSIS — H35372 Puckering of macula, left eye: Secondary | ICD-10-CM | POA: Diagnosis not present

## 2023-12-27 DIAGNOSIS — R519 Headache, unspecified: Secondary | ICD-10-CM | POA: Diagnosis not present

## 2023-12-27 DIAGNOSIS — H43813 Vitreous degeneration, bilateral: Secondary | ICD-10-CM | POA: Diagnosis not present

## 2023-12-28 DIAGNOSIS — K080 Exfoliation of teeth due to systemic causes: Secondary | ICD-10-CM | POA: Diagnosis not present

## 2024-01-25 DIAGNOSIS — Z471 Aftercare following joint replacement surgery: Secondary | ICD-10-CM | POA: Diagnosis not present

## 2024-01-25 DIAGNOSIS — Z96651 Presence of right artificial knee joint: Secondary | ICD-10-CM | POA: Diagnosis not present

## 2024-02-01 DIAGNOSIS — K080 Exfoliation of teeth due to systemic causes: Secondary | ICD-10-CM | POA: Diagnosis not present

## 2024-02-04 DIAGNOSIS — M25561 Pain in right knee: Secondary | ICD-10-CM | POA: Diagnosis not present

## 2024-03-10 ENCOUNTER — Encounter: Payer: Self-pay | Admitting: Family

## 2024-03-10 ENCOUNTER — Ambulatory Visit (INDEPENDENT_AMBULATORY_CARE_PROVIDER_SITE_OTHER): Admitting: Family

## 2024-03-10 ENCOUNTER — Ambulatory Visit (HOSPITAL_BASED_OUTPATIENT_CLINIC_OR_DEPARTMENT_OTHER)
Admission: RE | Admit: 2024-03-10 | Discharge: 2024-03-10 | Disposition: A | Discharge status: Home / Self Care | Source: Ambulatory Visit | Attending: Family | Admitting: Family

## 2024-03-10 VITALS — BP 130/82 | HR 75 | Ht 60.0 in | Wt 197.6 lb

## 2024-03-10 DIAGNOSIS — M545 Low back pain, unspecified: Secondary | ICD-10-CM | POA: Diagnosis not present

## 2024-03-10 DIAGNOSIS — M5136 Other intervertebral disc degeneration, lumbar region with discogenic back pain only: Secondary | ICD-10-CM | POA: Diagnosis not present

## 2024-03-10 DIAGNOSIS — M47816 Spondylosis without myelopathy or radiculopathy, lumbar region: Secondary | ICD-10-CM | POA: Diagnosis not present

## 2024-03-10 DIAGNOSIS — G8929 Other chronic pain: Secondary | ICD-10-CM

## 2024-03-10 DIAGNOSIS — M4805 Spinal stenosis, thoracolumbar region: Secondary | ICD-10-CM | POA: Diagnosis not present

## 2024-03-10 NOTE — Progress Notes (Signed)
 Heather Cox is a 78 y.o. female with the following history as recorded in EpicCare:  Patient Active Problem List   Diagnosis Date Noted   Dizziness 12/14/2022   Hx of colonic polyps 02/11/2017   Osteoarthritis of left knee 11/30/2016   Degenerative arthritis of left knee 11/30/2016    Current Outpatient Medications  Medication Sig Dispense Refill   Aspirin -Caffeine 400-32 MG TABS Take 2 tablets by mouth 2 (two) times daily as needed (for pain or headache).     atenolol  (TENORMIN ) 25 MG tablet Take 1 tablet (25 mg total) by mouth daily. 90 tablet 3   atorvastatin  (LIPITOR) 10 MG tablet TAKE 1 TABLET(10 MG) BY MOUTH EVERY EVENING 90 tablet 3   calcium  carbonate (TUMS - DOSED IN MG ELEMENTAL CALCIUM ) 500 MG chewable tablet Chew 1 tablet by mouth daily as needed for indigestion or heartburn.     Cholecalciferol (VITAMIN D3) 50 MCG (2000 UT) CAPS Take by mouth.     ibuprofen (ADVIL,MOTRIN) 200 MG tablet Take 400 mg by mouth 2 (two) times daily as needed for headache or moderate pain.     amoxicillin (AMOXIL) 500 MG capsule TAKE 4 CAPSULES BY MOUTH 1-2 HOURS PRIOR TO DENTAL WORK (Patient not taking: Reported on 03/10/2024)  0   No current facility-administered medications for this visit.    Allergies: Shellfish allergy and Shrimp (diagnostic)  Past Medical History:  Diagnosis Date   Allergy 2000   Arthritis    Cataract 1995   GERD (gastroesophageal reflux disease)    occasionally and will take Tums if needed   History of colon polyps    benign   Hyperlipidemia    takes Atorvastatin  daily   Hypertension    takes Atenolol  daily   Joint pain     Past Surgical History:  Procedure Laterality Date   ABDOMINAL HYSTERECTOMY  1994   CARDIAC CATHETERIZATION  2017   03/25/16 ([facility]): Normal coronaries (EF 60-65% by echo)   cataract surgery Bilateral    CESAREAN SECTION  1977   COLONOSCOPY     COLONOSCOPY N/A 06/10/2017   Procedure: COLONOSCOPY;  Surgeon: Ruby Corporal, MD;   [facility]: [facility];  Service: Endoscopy;  Laterality: N/A;  830   EYE SURGERY  1995   JOINT REPLACEMENT  2017   KNEE ARTHROPLASTY Right 06/08/2016   Procedure: RIGHT TOTAL KNEE ARTHROPLASTY WITH COMPUTER NAVIGATION;  Surgeon: Adonica Hoose, MD;  Location: [facility];  Service: Orthopedics;  Laterality: Right;  Request RNFA    TONSILLECTOMY     at age 28   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   Procedure: TOTAL KNEE ARTHROPLASTY with Navigation;  Surgeon: Adonica Hoose, MD;  Location: [facility];  Service: Orthopedics;  Laterality: Left;   WRIST SURGERY Left 1988   YAG LASER APPLICATION Left 12/30/2015   Procedure: YAG LASER APPLICATION;  Surgeon: Clay Cummins, MD;  Location: [facility];  Service: Ophthalmology;  Laterality: Left;  rescheduled to 2/20    YAG LASER APPLICATION Right 09/04/2016   Procedure: YAG LASER APPLICATION;  Surgeon: Clay Cummins, MD;  Location: [facility];  Service: Ophthalmology;  Laterality: Right;    No family history on file.  Social History   Tobacco Use   Smoking status: Never   Smokeless tobacco: Never  Substance Use Topics   Alcohol  use: No    Subjective:   Chronic left hip pain- has been under care of orthopedist; notes that recently has been experiencing some "different pain" in her left  buttock and wants to be try to determine source of symptoms; no numbness/ tingling; notes that pain "comes and goes"- pain was very noticeable yesterday/ no pain today; currently on Ibuprofen 600 mg tid per her orthopedist- had problems with left sided knee pain in March 2025;   Objective:  Vitals:   03/10/24 1456  BP: 130/82  Pulse: 75  SpO2: 97%  Weight: 197 lb 9.6 oz (89.6 kg)  Height: 5' (1.524 m)    General: Well developed, well nourished, in no acute distress  Skin : Warm and dry.  Head: Normocephalic and atraumatic  Lungs: Respirations unlabored;  Musculoskeletal: No deformities; no active joint inflammation  Extremities: No edema, cyanosis, clubbing   Vessels: Symmetric bilaterally  Neurologic: Alert and oriented; speech intact; face symmetrical; moves all extremities well; CNII-XII intact without focal deficit   Assessment:  1. 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  Orders Placed This Encounter  Procedures   DG Lumbar Spine 2-3 Views    Standing Status:   Future    Number of Occurrences:   1    Expiration Date:   09/10/2024    Reason for Exam (SYMPTOM  OR DIAGNOSIS REQUIRED):   low back pain    Preferred imaging location?:   [facility]    Requested Prescriptions    No prescriptions requested or ordered in this encounter

## 2024-03-23 ENCOUNTER — Ambulatory Visit: Payer: Self-pay | Admitting: Family

## 2024-03-24 DIAGNOSIS — H35372 Puckering of macula, left eye: Secondary | ICD-10-CM | POA: Diagnosis not present

## 2024-03-27 ENCOUNTER — Encounter: Payer: Self-pay | Admitting: Family

## 2024-03-28 NOTE — Telephone Encounter (Signed)
 Spoke with pt, pt is aware of her results. 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 Advised pt a message would be sent back to PCP. Pt is also requesting a copy of imaging results be sent to her Ortho provider Dr. Charol Copas.

## 2024-03-28 NOTE — Telephone Encounter (Signed)
 Spoke with pt, pt is aware and expressed understanding.

## 2024-04-21 DIAGNOSIS — M545 Low back pain, unspecified: Secondary | ICD-10-CM | POA: Diagnosis not present

## 2024-04-24 DIAGNOSIS — G8929 Other chronic pain: Secondary | ICD-10-CM | POA: Diagnosis not present

## 2024-05-04 DIAGNOSIS — M7918 Myalgia, other site: Secondary | ICD-10-CM | POA: Diagnosis not present

## 2024-05-10 DIAGNOSIS — H6123 Impacted cerumen, bilateral: Secondary | ICD-10-CM | POA: Diagnosis not present

## 2024-06-14 ENCOUNTER — Ambulatory Visit (INDEPENDENT_AMBULATORY_CARE_PROVIDER_SITE_OTHER)

## 2024-06-14 VITALS — BP 130/72 | Ht 60.0 in | Wt 187.0 lb

## 2024-06-14 DIAGNOSIS — Z0000 Encounter for general adult medical examination without abnormal findings: Secondary | ICD-10-CM | POA: Diagnosis not present

## 2024-06-14 NOTE — Progress Notes (Signed)
 Because this visit was a virtual/telehealth visit,  certain criteria was not obtained, such a blood pressure, CBG if applicable, and timed get up and go. Any medications not marked as taking were not mentioned during the medication reconciliation part of the visit. Any vitals not documented were not able to be obtained due to this being a telehealth visit or patient was unable to self-report a recent blood pressure reading due to a lack of equipment at home via telehealth. Vitals that have been documented are verbally provided by the patient.  This visit was performed by a medical professional under my direct supervision. I was immediately available for consultation/collaboration. I have reviewed and agree with the Annual Wellness Visit documentation.  Subjective:   Heather Cox is a 78 y.o. who presents for a Medicare Wellness preventive visit.  As a reminder, Annual Wellness Visits don't include a physical exam, and some assessments may be limited, especially if this visit is performed virtually. We may recommend an in-person follow-up visit with your provider if needed.  Visit Complete: Virtual I connected with  [name] on 06/14/24 by a audio enabled telemedicine application and verified that I am speaking with the correct person using two identifiers.  Patient Location: Home  Provider Location: Home Office  I discussed the limitations of evaluation and management by telemedicine. The patient expressed understanding and agreed to proceed.  Vital Signs: Because this visit was a virtual/telehealth visit, some criteria may be missing or patient reported. Any vitals not documented were not able to be obtained and vitals that have been documented are patient reported.  VideoDeclined- This patient declined Librarian, academic. Therefore the visit was completed with audio only.  Persons Participating in Visit: Patient.  AWV Questionnaire: No: Patient  Medicare AWV questionnaire was not completed prior to this visit.  Cardiac Risk Factors include: advanced age (>23men, >61 women);obesity (BMI >30kg/m2)     Objective:    Today's Vitals   06/14/24 1453  BP: 130/72  Weight: 187 lb (84.8 kg)  Height: 5' (1.524 m)  PainSc: 5    Body mass index is 36.52 kg/m.     06/14/2024    2:52 PM 06/10/2023   10:23 AM 12/16/2022   11:13 AM 02/24/2022    2:36 PM 07/02/2021   11:45 AM 12/13/2020   10:42 AM 06/10/2017    7:19 AM  Advanced Directives  Does Patient Have a Medical Advance Directive? Yes Yes Yes Yes No Yes Yes   Type of Estate agent of [place];Living will Healthcare Power of [place];Living will Healthcare Power of [place];Living will Healthcare Power of [place];Living will  Healthcare Power of [place];Living will Healthcare Power of [place];Living will  Does patient want to make changes to medical advance directive? No - Patient declined No - Patient declined       Copy of Healthcare Power of Attorney in Chart? No - copy requested No - copy requested  No - copy requested   No - copy requested   Would patient like information on creating a medical advance directive?     No - Patient declined No - Patient declined      Data saved with a previous flowsheet row definition    Current Medications (verified) Outpatient Encounter Medications as of 06/14/2024  Medication Sig   Aspirin -Caffeine 400-32 MG TABS Take 2 tablets by mouth 2 (two) times daily as needed (for pain or headache).   atenolol  (TENORMIN ) 25 MG tablet Take 1 tablet (25  mg total) by mouth daily.   atorvastatin  (LIPITOR) 10 MG tablet TAKE 1 TABLET(10 MG) BY MOUTH EVERY EVENING   calcium  carbonate (TUMS - DOSED IN MG ELEMENTAL CALCIUM ) 500 MG chewable tablet Chew 1 tablet by mouth daily as needed for indigestion or heartburn.   Cholecalciferol (VITAMIN D3) 50 MCG (2000 UT) CAPS Take by mouth.   ibuprofen (ADVIL,MOTRIN) 200 MG tablet Take 400 mg by mouth 2  (two) times daily as needed for headache or moderate pain.   amoxicillin (AMOXIL) 500 MG capsule TAKE 4 CAPSULES BY MOUTH 1-2 HOURS PRIOR TO DENTAL WORK (Patient not taking: Reported on 06/14/2024)   No facility-administered encounter medications on file as of 06/14/2024.    Allergies (verified) Shellfish allergy and Shrimp (diagnostic)   History: Past Medical History:  Diagnosis Date   Allergy 2000   Arthritis    Cataract 1995   GERD (gastroesophageal reflux disease)    occasionally and will take Tums if needed   History of colon polyps    benign   Hyperlipidemia    takes Atorvastatin  daily   Hypertension    takes Atenolol  daily   Joint pain    Past Surgical History:  Procedure Laterality Date   ABDOMINAL HYSTERECTOMY  1994   CARDIAC CATHETERIZATION  2017   03/25/16 ([facility]): Normal coronaries (EF 60-65% by echo)   cataract surgery Bilateral    CESAREAN SECTION  1977   COLONOSCOPY     COLONOSCOPY N/A 06/10/2017   Procedure: COLONOSCOPY;  Surgeon: Golda Claudis PENNER, MD;  Location: [facility];  Service: Endoscopy;  Laterality: N/A;  830   EYE SURGERY  1995   JOINT REPLACEMENT  2017   KNEE ARTHROPLASTY Right 06/08/2016   Procedure: RIGHT TOTAL KNEE ARTHROPLASTY WITH COMPUTER NAVIGATION;  Surgeon: Redell Shoals, MD;  Location: [facility] OR;  Service: Orthopedics;  Laterality: Right;  Request RNFA    TONSILLECTOMY     at age 14   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 No pertinent family history. Social History   Socioeconomic History   Marital status: Widowed    Spouse name: Not on file   Number of children: Not on file   Years of education: Not on file   Highest education level: Master's degree (e.g., MA, MS, MEng, MEd, MSW, MBA)  Occupational History   Not on file  Tobacco Use   Smoking status: Never   Smokeless tobacco: Never  Substance and Sexual Activity   Alcohol  use: No   Drug use: No   Sexual activity: Not Currently    Birth control/protection: None  Other Topics Concern   Not on file  Social History Narrative   Not on file   Social Drivers of Health   Financial Resource Strain: Low Risk  (06/14/2024)   Overall Financial Resource Strain (CARDIA)    Difficulty of Paying Living Expenses: Not hard at all  Food Insecurity: No Food Insecurity (06/14/2024)   Hunger Vital  Sign    Worried About Programme researcher, broadcasting/film/video in the Last Year: Never true    Ran Out of Food in the Last Year: Never true  Transportation Needs: No Transportation Needs (06/14/2024)   PRAPARE - Administrator, Civil Service (Medical): No    Lack of Transportation (Non-Medical): No  Physical Activity: Patient Declined (06/14/2024)   Exercise Vital Sign    Days of Exercise per Week: Patient declined    Minutes of Exercise per Session: Patient declined  Stress: No Stress Concern Present (06/14/2024)   [employer] of Occupational Health - Occupational Stress Questionnaire    Feeling of Stress: Not at all  Social Connections: Moderately Integrated (06/14/2024)   Social Connection and Isolation Panel    Frequency of Communication with Friends and Family: More than three times a week    Frequency of Social Gatherings with Friends and Family: Twice a week    Attends Religious Services: More than 4 times per year    Active Member of [employer] or Organizations: Yes    Attends Banker Meetings: More than 4 times per year     Marital Status: Widowed    Tobacco Counseling Counseling given: Not Answered    Clinical Intake:  Pre-visit preparation completed: Yes  Pain : 0-10 Pain Score: 5  Pain Type: Chronic pain Pain Location: Hip Pain Orientation: Left Pain Descriptors / Indicators: Aching Pain Onset: Today Pain Frequency: Constant     BMI - recorded: 36.52 Nutritional Risks: None Diabetes: No  Lab Results  Component Value Date   HGBA1C 5.3 09/30/2023     How often do you need to have someone help you when you read instructions, pamphlets, or other written materials from your doctor or pharmacy?: 1 - Never What is the last grade level you completed in school?: Masters Degree  Interpreter Needed?: No  Information entered by :: Jennings Stirling,CMA   Activities of Daily Living     06/14/2024    2:56 PM  In your present state of health, do you have any difficulty performing the following activities:  Hearing? 0  Vision? 0  Difficulty concentrating or making decisions? 0  Walking or climbing stairs? 0  Dressing or bathing? 0  Doing errands, shopping? 0  Preparing Food and eating ? N  Using the Toilet? N  In the past six months, have you accidently leaked urine? N  Do you have problems with loss of bowel control? N  Managing your Medications? N  Managing your Finances? N  Housekeeping or managing your Housekeeping? N    Patient Care Team: Jason Leita Repine, FNP as PCP - General (Internal Medicine)  I have updated your Care Teams any recent Medical Services you may have received from other providers in the past year.     Assessment:   This is a routine wellness examination for Margarit.  Hearing/Vision screen Hearing Screening - Comments:: Patient wears hearing aids  Vision Screening - Comments:: Patient wears glasses    Goals Addressed             This Visit's Progress    Patient Stated       Patient would like to lose more weight       Depression  Screen     06/14/2024    2:57 PM 09/30/2023   10:24 AM 06/10/2023   10:27 AM 09/17/2022   10:39 AM 02/24/2022    2:36 PM 09/12/2021   10:36 AM  PHQ 2/9 Scores  PHQ - 2 Score 0 0 0 0 0 0  PHQ- 9 Score 0         Fall Risk     06/14/2024    2:55 PM 09/30/2023   10:24 AM 06/10/2023    8:00 AM 09/17/2022   10:39 AM 02/24/2022    2:38 PM  Fall Risk   Falls in the past year? 0 0 0 0 0  Number falls in past yr: 0 0 0 0 0  Injury with Fall? 0 0 0 0 0  Risk for fall due to : No Fall Risks No Fall Risks No Fall Risks  Impaired vision  Follow up Falls evaluation completed Falls evaluation completed Falls evaluation completed Falls evaluation completed  Falls prevention discussed      Data saved with a previous flowsheet row definition    MEDICARE RISK AT HOME:  Medicare Risk at Home Any stairs in or around the home?: No If so, are there any without handrails?: No Home free of loose throw rugs in walkways, pet beds, electrical cords, etc?: Yes Adequate lighting in your home to reduce risk of falls?: Yes Life alert?: No Use of a cane, walker or w/c?: No Grab bars in the bathroom?: No Shower chair or bench in shower?: Yes Elevated toilet seat or a handicapped toilet?: No  TIMED UP AND GO:  Was the test performed?  No  Cognitive Function: 6CIT completed        06/14/2024    2:58 PM 06/10/2023   10:28 AM 02/24/2022    2:39 PM  6CIT Screen  What Year? 0 points 0 points 0 points  What month? 0 points 0 points 0 points  What time? 0 points 0 points 0 points  Count back from 20 0 points 0 points 0 points  Months in reverse 0 points 0 points 0 points  Repeat phrase 0 points 0 points 0 points  Total Score 0 points 0 points 0 points    Immunizations Immunization History  Administered Date(s) Administered   Fluad Quad(high Dose 65+) 09/17/2022   Fluad Trivalent(High Dose 65+) 09/30/2023   Influenza-Unspecified 09/09/2020   Janssen (J&J) SARS-COV-2 Vaccination 02/12/2020   Moderna  SARS-COV2 Booster Vaccination 05/15/2021   Pneumococcal Polysaccharide-23 04/26/2014   Tdap 04/04/2021    Screening Tests Health Maintenance  Topic Date Due   INFLUENZA VACCINE  06/09/2024   Medicare Annual Wellness (AWV)  06/14/2025   DTaP/Tdap/Td (2 - Td or Tdap) 04/05/2031   DEXA SCAN  Completed   Hepatitis B Vaccines  Aged Out   HPV VACCINES  Aged Out   Meningococcal B Vaccine  Aged Out   Pneumococcal Vaccine: 50+ Years  Discontinued   COVID-19 Vaccine  Discontinued   Hepatitis C Screening  Discontinued   Zoster Vaccines- Shingrix  Discontinued    Health Maintenance  Health Maintenance Due  Topic Date Due   INFLUENZA VACCINE  06/09/2024   Health Maintenance Items Addressed:patient declined vaccination   Additional Screening:  Vision Screening: Recommended annual ophthalmology exams for early detection of glaucoma and other disorders of the eye. Would you like a referral to an eye doctor? No    Dental Screening: Recommended annual dental exams for proper oral hygiene  Community Resource Referral / Chronic Care Management: CRR required this visit?  No   CCM required this visit?  No   Plan:    I have personally reviewed and noted the following in the patient's chart:   Medical and social history Use  of alcohol , tobacco or illicit drugs  Current medications and supplements including opioid prescriptions. Patient is not currently taking opioid prescriptions. Functional ability and status Nutritional status Physical activity Advanced directives List of other physicians Hospitalizations, surgeries, and ER visits in previous 12 months Vitals Screenings to include cognitive, depression, and falls Referrals and appointments  In addition, I have reviewed and discussed with patient certain preventive protocols, quality metrics, and best practice recommendations. A written personalized care plan for preventive services as well as general preventive health  recommendations were provided to patient.   Lyle MARLA Right, [place]   06/14/2024   After Visit Summary: (MyChart) Due to this being a telephonic visit, the after visit summary with patients personalized plan was offered to patient via MyChart   Notes: Nothing significant to report at this time.

## 2024-06-14 NOTE — Patient Instructions (Signed)
 Heather Cox , Thank you for taking time out of your busy schedule to complete your Annual Wellness Visit with me. I enjoyed our conversation and look forward to speaking with you again next year. I, as well as your care team,  appreciate your ongoing commitment to your health goals. Please review the following plan we discussed and let me know if I can assist you in the future. Your Game plan/ To Do List    Referrals: If you haven't heard from the office you've been referred to, please reach out to them at the phone provided.   Follow up Visits: We will see or speak with you next year for your Next Medicare AWV with our clinical staff Have you seen your provider in the last 6 months (3 months if uncontrolled diabetes)? Yes  Clinician Recommendations:  Aim for 30 minutes of exercise or brisk walking, 6-8 glasses of water , and 5 servings of fruits and vegetables each day.       This is a list of the screenings recommended for you:  Health Maintenance  Topic Date Due   Flu Shot  06/09/2024   Medicare Annual Wellness Visit  06/14/2025   DTaP/Tdap/Td vaccine (2 - Td or Tdap) 04/05/2031   DEXA scan (bone density measurement)  Completed   Hepatitis B Vaccine  Aged Out   HPV Vaccine  Aged Out   Meningitis B Vaccine  Aged Out   Pneumococcal Vaccine for age over 45  Discontinued   COVID-19 Vaccine  Discontinued   Hepatitis C Screening  Discontinued   Zoster (Shingles) Vaccine  Discontinued    Advanced directives: (Copy Requested) Please bring a copy of your health care power of attorney and living will to the office to be added to your chart at your convenience. You can mail to [facility] [address] [place], [place] [address] or email to [email or web address] Advance Care Planning is important because it:  [x]  Makes sure you receive the medical care that is consistent with your values, goals, and preferences  [x]  It provides guidance to your family and loved ones and  reduces their decisional burden about whether or not they are making the right decisions based on your wishes.  Follow the link provided in your after visit summary or read over the paperwork we have mailed to you to help you started getting your Advance Directives in place. If you need assistance in completing these, please reach out to us  so that we can help you!  See attachments for Preventive Care and Fall Prevention Tips.

## 2024-06-27 DIAGNOSIS — K080 Exfoliation of teeth due to systemic causes: Secondary | ICD-10-CM | POA: Diagnosis not present

## 2024-08-25 ENCOUNTER — Other Ambulatory Visit: Payer: Self-pay | Admitting: Family Medicine

## 2024-08-25 NOTE — Telephone Encounter (Unsigned)
 Copied from CRM [id number]. Topic: Clinical - Medication Refill >> Aug 25, 2024 11:33 AM [name] wrote: Medication: atorvastatin  (LIPITOR) 10 MG tablet   Has the patient contacted their pharmacy? Yes (Agent: If no, request that the patient contact the pharmacy for the refill. If patient does not wish to contact the pharmacy document the reason why and proceed with request.) (Agent: If yes, when and what did the pharmacy advise?)  This is the patient's preferred pharmacy:  [id number] - [place], [place] - [address] [place] [address] Phone: [phone] Fax: [phone]  Is this the correct pharmacy for this prescription? Yes If no, delete pharmacy and type the correct one.   Has the prescription been filled recently? No  Is the patient out of the medication? Yes  Has the patient been seen for an appointment in the last year OR does the patient have an upcoming appointment? Yes  Can we respond through MyChart? Yes  Agent: Please be advised that Rx refills may take up to 3 business days. We ask that you follow-up with your pharmacy.

## 2024-08-26 MED ORDER — ATORVASTATIN CALCIUM 10 MG PO TABS: 10.0000 mg | ORAL_TABLET | Freq: Every day | ORAL | 3 refills | Status: AC

## 2024-10-03 ENCOUNTER — Encounter: Payer: Medicare Other | Admitting: Family

## 2024-11-23 ENCOUNTER — Encounter: Payer: Self-pay | Admitting: Family Medicine

## 2024-11-23 ENCOUNTER — Ambulatory Visit (INDEPENDENT_AMBULATORY_CARE_PROVIDER_SITE_OTHER): Admitting: Family Medicine

## 2024-11-23 VITALS — BP 130/84 | HR 67 | Temp 98.1°F | Resp 18 | Ht 60.0 in | Wt 188.0 lb

## 2024-11-23 DIAGNOSIS — I10 Essential (primary) hypertension: Secondary | ICD-10-CM | POA: Diagnosis not present

## 2024-11-23 DIAGNOSIS — Z0000 Encounter for general adult medical examination without abnormal findings: Secondary | ICD-10-CM | POA: Insufficient documentation

## 2024-11-23 DIAGNOSIS — E785 Hyperlipidemia, unspecified: Secondary | ICD-10-CM

## 2024-11-23 MED ORDER — ATENOLOL 25 MG PO TABS: 25.0000 mg | ORAL_TABLET | Freq: Every day | ORAL | 3 refills | Status: AC

## 2024-11-23 NOTE — Progress Notes (Signed)
 "  Subjective:    Patient ID: Heather Cox, female    DOB: 1946-02-13, 79 y.o.   MRN: [id number]  Chief Complaint  Patient presents with   Transfer of Care    HPI Patient is in today for cpe.   Discussed the use of AI scribe software for clinical note transcription with the patient, who gave verbal consent to proceed.  History of Present Illness Heather Cox is an 79 year old female who presents for a regular physical exam.  She reports no new surgeries or significant changes in her health since her last visit. She is currently taking atenolol  for hypertension and atorvastatin  for hyperlipidemia. She also takes vitamin D3 but not calcium .  She has a history of hypertension, managed with atenolol , with no new symptoms or changes in her blood pressure management.  She experiences arthritis, particularly affecting her left hip, causing significant pain and necessitating the use of a cane for ambulation. The pain sometimes radiates to her buttocks and groin. Previous x-rays have shown degeneration. She is trying to determine whether the pain is primarily from her hip or back.  She has a family history of hypertension and heart problems. Her oldest brother has prostate cancer, and several family members, including one brother, have arthritis. Her father had hypertension, and her mother had a myocardial infarction.  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 She has one child who lives nearby and three step-grandchildren. She uses hearing aids, which have improved her ability to communicate.  No new surgeries and no changes in family history.    Past Medical History:  Diagnosis Date   Allergy 2000   Arthritis    Cataract 1995   GERD (gastroesophageal reflux disease)    occasionally and will take Tums if needed   History of colon polyps    benign   Hyperlipidemia    takes Atorvastatin  daily    Hypertension    takes Atenolol  daily   Joint pain     Past Surgical History:  Procedure Laterality Date   ABDOMINAL HYSTERECTOMY  1994   CARDIAC CATHETERIZATION  2017   03/25/16 ([facility]): Normal coronaries (EF 60-65% by echo)   cataract surgery Bilateral    CESAREAN SECTION  1977   COLONOSCOPY     COLONOSCOPY N/A 06/10/2017   Procedure: COLONOSCOPY;  Surgeon: Golda Claudis PENNER, MD;  Location: [facility];  Service: Endoscopy;  Laterality: N/A;  830   EYE SURGERY  1995   JOINT REPLACEMENT  2017   KNEE ARTHROPLASTY Right 06/08/2016   Procedure: RIGHT TOTAL KNEE ARTHROPLASTY WITH COMPUTER NAVIGATION;  Surgeon: Redell Shoals, MD;  Location: [facility];  Service: Orthopedics;  Laterality: Right;  Request RNFA    TONSILLECTOMY     at age 25   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   Procedure: TOTAL KNEE ARTHROPLASTY with Navigation;  Surgeon: Redell Shoals, MD;  Location: [facility] OR;  Service: Orthopedics;  Laterality: Left;   WRIST SURGERY Left 1988   YAG LASER APPLICATION Left 12/30/2015   Procedure: YAG LASER APPLICATION;  Surgeon: Dow JULIANNA Burke, MD;  Location: [facility];  Service: Ophthalmology;  Laterality: Left;  rescheduled to 2/20    YAG LASER APPLICATION Right 09/04/2016   Procedure: YAG LASER APPLICATION;  Surgeon: Dow JULIANNA Burke, MD;  Location: [facility];  Service: Ophthalmology;  Laterality: Right;    Family History  Problem Relation Age of Onset  Osteoarthritis Mother    Hypertension Mother    Heart attack Mother    Osteoarthritis Father    Hypertension Father    Cancer Brother    Prostate cancer Brother    Healthy Other     Social History   Socioeconomic History   Marital status: Widowed    Spouse name: Not on file   Number of children: 1   Years of education: Not on file   Highest education level: Master's degree (e.g., MA, MS, MEng, MEd, MSW, MBA)  Occupational History   Occupation: food service    Comment: [employer] -- retired  Tobacco Use   Smoking status:  Never   Smokeless tobacco: Never  Substance and Sexual Activity   Alcohol  use: No   Drug use: No   Sexual activity: Not Currently    Birth control/protection: None  Other Topics Concern   Not on file  Social History Narrative   Not on file   Social Drivers of Health   Tobacco Use: Low Risk (11/23/2024)   Patient History    Smoking Tobacco Use: Never    Smokeless Tobacco Use: Never    Passive Exposure: Not on file  Financial Resource Strain: Low Risk (06/14/2024)   Overall Financial Resource Strain (CARDIA)    Difficulty of Paying Living Expenses: Not hard at all  Food Insecurity: No Food Insecurity (06/14/2024)   Epic    Worried About Radiation Protection Practitioner of Food in the Last Year: Never true    Ran Out of Food in the Last Year: Never true  Transportation Needs: No Transportation Needs (06/14/2024)   Epic    Lack of Transportation (Medical): No    Lack of Transportation (Non-Medical): No  Physical Activity: Patient Declined (06/14/2024)   Exercise Vital Sign    Days of Exercise per Week: Patient declined    Minutes of Exercise per Session: Patient declined  Stress: No Stress Concern Present (06/14/2024)   [employer] of Occupational Health - Occupational Stress Questionnaire    Feeling of Stress: Not at all  Social Connections: Moderately Integrated (06/14/2024)   Social Connection and Isolation Panel    Frequency of Communication with Friends and Family: More than three times a week    Frequency of Social Gatherings with Friends and Family: Twice a week    Attends Religious Services: More than 4 times per year    Active Member of [employer] or Organizations: Yes    Attends Banker Meetings: More than 4 times per year    Marital Status: Widowed  Intimate Partner Violence: Not At Risk (06/14/2024)   Epic    Fear of Current or Ex-Partner: No    Emotionally Abused: No    Physically Abused: No    Sexually Abused: No  Depression (PHQ2-9): Low Risk (06/14/2024)   Depression (PHQ2-9)     PHQ-2 Score: 0  Alcohol  Screen: Low Risk (06/14/2024)   Alcohol  Screen    Last Alcohol  Screening Score (AUDIT): 0  Housing: Low Risk (06/14/2024)   Epic    Unable to Pay for Housing in the Last Year: No    Number of Times Moved in the Last Year: 0    Homeless in the Last Year: No  Utilities: Not At Risk (06/14/2024)   Epic    Threatened with loss of utilities: No  Health Literacy: Adequate Health Literacy (06/14/2024)   B1300 Health Literacy    Frequency of need for help with medical instructions: Never    Outpatient Medications Prior  to Visit  Medication Sig Dispense Refill   Aspirin -Caffeine 400-32 MG TABS Take 2 tablets by mouth 2 (two) times daily as needed (for pain or headache).     atorvastatin  (LIPITOR) 10 MG tablet Take 1 tablet (10 mg total) by mouth daily. 90 tablet 3   Cholecalciferol (VITAMIN D3) 50 MCG (2000 UT) CAPS Take by mouth.     ibuprofen (ADVIL,MOTRIN) 200 MG tablet Take 400 mg by mouth 2 (two) times daily as needed for headache or moderate pain.     atenolol  (TENORMIN ) 25 MG tablet Take 1 tablet (25 mg total) by mouth daily. 90 tablet 3   calcium  carbonate (TUMS - DOSED IN MG ELEMENTAL CALCIUM ) 500 MG chewable tablet Chew 1 tablet by mouth daily as needed for indigestion or heartburn.     amoxicillin (AMOXIL) 500 MG capsule TAKE 4 CAPSULES BY MOUTH 1-2 HOURS PRIOR TO DENTAL WORK (Patient not taking: Reported on 11/23/2024)  0   No facility-administered medications prior to visit.    Allergies  Allergen Reactions   Shellfish Allergy Anaphylaxis, Itching and Swelling    Tongue swelling, lips numb Tongue swelling, lips numb   Shrimp (Diagnostic) Itching    Mouth swelling and rash     Review of Systems  Constitutional:  Negative for chills, fever and malaise/fatigue.  HENT:  Negative for congestion and hearing loss.   Eyes:  Negative for blurred vision and discharge.  Respiratory:  Negative for cough, sputum production and shortness of breath.    Cardiovascular:  Negative for chest pain, palpitations and leg swelling.  Gastrointestinal:  Negative for abdominal pain, blood in stool, constipation, diarrhea, heartburn, nausea and vomiting.  Genitourinary:  Negative for dysuria, frequency, hematuria and urgency.  Musculoskeletal:  Negative for back pain, falls and myalgias.  Skin:  Negative for rash.  Neurological:  Negative for dizziness, sensory change, loss of consciousness, weakness and headaches.  Endo/Heme/Allergies:  Negative for environmental allergies. Does not bruise/bleed easily.  Psychiatric/Behavioral:  Negative for depression and suicidal ideas. The patient is not nervous/anxious and does not have insomnia.        Objective:    Physical Exam Vitals and nursing note reviewed.  Constitutional:      General: She is not in acute distress.    Appearance: Normal appearance. She is well-developed.  HENT:     Head: Normocephalic and atraumatic.     Right Ear: Tympanic membrane, ear canal and external ear normal. There is no impacted cerumen.     Left Ear: Tympanic membrane, ear canal and external ear normal. There is no impacted cerumen.     Nose: Nose normal.     Mouth/Throat:     Mouth: Mucous membranes are moist.     Pharynx: Oropharynx is clear. No oropharyngeal exudate or posterior oropharyngeal erythema.  Eyes:     General: No scleral icterus.       Right eye: No discharge.        Left eye: No discharge.     Conjunctiva/sclera: Conjunctivae normal.     Pupils: Pupils are equal, round, and reactive to light.  Neck:     Thyroid : No thyromegaly or thyroid  tenderness.     Vascular: No JVD.  Cardiovascular:     Rate and Rhythm: Normal rate and regular rhythm.     Heart sounds: Normal heart sounds. No murmur heard. Pulmonary:     Effort: Pulmonary effort is normal. No respiratory distress.     Breath sounds: Normal breath sounds.  Abdominal:  General: Bowel sounds are normal. There is no distension.      Palpations: Abdomen is soft. There is no mass.     Tenderness: There is no abdominal tenderness. There is no guarding or rebound.  Genitourinary:    Vagina: Normal.  Musculoskeletal:        General: Normal range of motion.     Cervical back: Normal range of motion and neck supple.     Right lower leg: No edema.     Left lower leg: No edema.  Lymphadenopathy:     Cervical: No cervical adenopathy.  Skin:    General: Skin is warm and dry.     Findings: No erythema or rash.  Neurological:     Mental Status: She is alert and oriented to person, place, and time.     Cranial Nerves: No cranial nerve deficit.     Deep Tendon Reflexes: Reflexes are normal and symmetric.  Psychiatric:        Mood and Affect: Mood normal.        Behavior: Behavior normal.        Thought Content: Thought content normal.        Judgment: Judgment normal.     BP 130/84 (BP Location: Right Arm, Patient Position: Sitting, Cuff Size: Large)   Pulse 67   Temp 98.1 F (36.7 C) (Oral)   Resp 18   Ht 5' (1.524 m)   Wt 188 lb (85.3 kg)   SpO2 99%   BMI 36.72 kg/m  Wt Readings from Last 3 Encounters:  11/23/24 188 lb (85.3 kg)  06/14/24 187 lb (84.8 kg)  03/10/24 197 lb 9.6 oz (89.6 kg)    Diabetic Foot Exam - Simple   No data filed    Lab Results  Component Value Date   WBC 6.7 09/30/2023   HGB 13.5 09/30/2023   HCT 42.6 09/30/2023   PLT 253.0 09/30/2023   GLUCOSE 85 09/30/2023   CHOL 161 09/30/2023   TRIG 162.0 (H) 09/30/2023   HDL 56.30 09/30/2023   LDLCALC 73 09/30/2023   ALT 14 09/30/2023   AST 22 09/30/2023   NA 140 09/30/2023   K 4.3 09/30/2023   CL 104 09/30/2023   CREATININE 1.05 09/30/2023   BUN 19 09/30/2023   CO2 27 09/30/2023   TSH 1.75 09/17/2022   HGBA1C 5.3 09/30/2023    Lab Results  Component Value Date   TSH 1.75 09/17/2022   Lab Results  Component Value Date   WBC 6.7 09/30/2023   HGB 13.5 09/30/2023   HCT 42.6 09/30/2023   MCV 91.0 09/30/2023   PLT 253.0  09/30/2023   Lab Results  Component Value Date   NA 140 09/30/2023   K 4.3 09/30/2023   CO2 27 09/30/2023   GLUCOSE 85 09/30/2023   BUN 19 09/30/2023   CREATININE 1.05 09/30/2023   BILITOT 0.5 09/30/2023   ALKPHOS 77 09/30/2023   AST 22 09/30/2023   ALT 14 09/30/2023   PROT 6.8 09/30/2023   ALBUMIN  4.3 09/30/2023   CALCIUM  9.6 09/30/2023   ANIONGAP 6 12/01/2016   GFR 50.40 (L) 09/30/2023   Lab Results  Component Value Date   CHOL 161 09/30/2023   Lab Results  Component Value Date   HDL 56.30 09/30/2023   Lab Results  Component Value Date   LDLCALC 73 09/30/2023   Lab Results  Component Value Date   TRIG 162.0 (H) 09/30/2023   Lab Results  Component Value Date  CHOLHDL 3 09/30/2023   Lab Results  Component Value Date   HGBA1C 5.3 09/30/2023       Assessment & Plan:  Preventative health care Assessment & Plan: Ghm utd Check labs  See AVS Health Maintenance  Topic Date Due   Influenza Vaccine  06/09/2024   Medicare Annual Wellness (AWV)  06/14/2025   DTaP/Tdap/Td (2 - Td or Tdap) 04/05/2031   Bone Density Scan  Completed   Meningococcal B Vaccine  Aged Out   Pneumococcal Vaccine: 50+ Years  Discontinued   COVID-19 Vaccine  Discontinued   Hepatitis C Screening  Discontinued   Zoster Vaccines- Shingrix  Discontinued      Primary hypertension Assessment & Plan: Well controlled, no changes to meds. Encouraged heart healthy diet such as the DASH diet and exercise as tolerated.    Orders: -     Atenolol ; Take 1 tablet (25 mg total) by mouth daily.  Dispense: 90 tablet; Refill: 3 -     CBC with Differential/Platelet -     Comprehensive metabolic panel with GFR -     TSH  Hyperlipidemia, unspecified hyperlipidemia type Assessment & Plan: Encourage heart healthy diet such as MIND or DASH diet, increase exercise, avoid trans fats, simple carbohydrates and processed foods, consider a krill or fish or flaxseed oil cap daily.    Orders: -     CBC  with Differential/Platelet -     Comprehensive metabolic panel with GFR -     Lipid panel -     TSH  Assessment and Plan Assessment & Plan Primary hypertension   Chronic condition managed with atenolol . Continue atenolol  for blood pressure management.  Hyperlipidemia   Chronic condition managed with atorvastatin . Continue atorvastatin  for cholesterol management.  Osteoarthritis of left hip   Chronic osteoarthritis with recent increase in pain, possibly exacerbated by weather changes. Pain radiates to the groin area, suggesting hip involvement. Referred to orthopedic surgeon Dr. Geraldine for evaluation of left hip osteoarthritis. Provided handicap parking permit.  Degenerative disc disease of lumbar spine   Chronic condition with pain radiating to the buttocks, possibly indicating sciatic involvement. Previous evaluation suggested no need for surgical intervention or injections. Referred to orthopedic surgeon Dr. Geraldine for evaluation of lumbar spine and differentiation between hip and back pain.  Bilateral sensorineural hearing loss   Managed with hearing aids, which are effective in improving hearing. Continue use of hearing aids.  General Health Maintenance   Vaccinations discussed. She did not receive a flu shot this year, considering it too late in the season. Offered flu shot if desired. Ordered blood work for routine health maintenance.    Terique Kawabata R Lowne Chase, DO  "

## 2024-11-23 NOTE — Assessment & Plan Note (Signed)
 Well controlled, no changes to meds. Encouraged heart healthy diet such as the DASH diet and exercise as tolerated.

## 2024-11-23 NOTE — Assessment & Plan Note (Signed)
 Ghm utd Check labs  See AVS Health Maintenance  Topic Date Due   Influenza Vaccine  06/09/2024   Medicare Annual Wellness (AWV)  06/14/2025   DTaP/Tdap/Td (2 - Td or Tdap) 04/05/2031   Bone Density Scan  Completed   Meningococcal B Vaccine  Aged Out   Pneumococcal Vaccine: 50+ Years  Discontinued   COVID-19 Vaccine  Discontinued   Hepatitis C Screening  Discontinued   Zoster Vaccines- Shingrix  Discontinued

## 2024-11-23 NOTE — Assessment & Plan Note (Signed)
 Encourage heart healthy diet such as MIND or DASH diet, increase exercise, avoid trans fats, simple carbohydrates and processed foods, consider a krill or fish or flaxseed oil cap daily.

## 2024-11-24 LAB — COMPREHENSIVE METABOLIC PANEL WITH GFR
ALT: 13 U/L (ref 3–35)
AST: 22 U/L (ref 5–37)
Albumin: 4.4 g/dL (ref 3.5–5.2)
Alkaline Phosphatase: 78 U/L (ref 39–117)
BUN: 23 mg/dL (ref 6–23)
CO2: 28 meq/L (ref 19–32)
Calcium: 9.5 mg/dL (ref 8.4–10.5)
Chloride: 103 meq/L (ref 96–112)
Creatinine, Ser: 1.11 mg/dL (ref 0.40–1.20)
GFR: 46.77 mL/min — ABNORMAL LOW
Glucose, Bld: 89 mg/dL (ref 70–99)
Potassium: 4.6 meq/L (ref 3.5–5.1)
Sodium: 139 meq/L (ref 135–145)
Total Bilirubin: 0.6 mg/dL (ref 0.2–1.2)
Total Protein: 6.9 g/dL (ref 6.0–8.3)

## 2024-11-24 LAB — LIPID PANEL
Cholesterol: 158 mg/dL (ref 28–200)
HDL: 67.3 mg/dL
LDL Cholesterol: 69 mg/dL (ref 10–99)
NonHDL: 90.67
Total CHOL/HDL Ratio: 2
Triglycerides: 107 mg/dL (ref 10.0–149.0)
VLDL: 21.4 mg/dL (ref 0.0–40.0)

## 2024-11-24 LAB — CBC WITH DIFFERENTIAL/PLATELET
Basophils Absolute: 0 K/uL (ref 0.0–0.1)
Basophils Relative: 0.7 % (ref 0.0–3.0)
Eosinophils Absolute: 0.2 K/uL (ref 0.0–0.7)
Eosinophils Relative: 2.7 % (ref 0.0–5.0)
HCT: 36.8 % (ref 36.0–46.0)
Hemoglobin: 12.1 g/dL (ref 12.0–15.0)
Lymphocytes Relative: 22.1 % (ref 12.0–46.0)
Lymphs Abs: 1.3 K/uL (ref 0.7–4.0)
MCHC: 32.8 g/dL (ref 30.0–36.0)
MCV: 86.8 fl (ref 78.0–100.0)
Monocytes Absolute: 0.6 K/uL (ref 0.1–1.0)
Monocytes Relative: 9.4 % (ref 3.0–12.0)
Neutro Abs: 3.8 K/uL (ref 1.4–7.7)
Neutrophils Relative %: 65.1 % (ref 43.0–77.0)
Platelets: 219 K/uL (ref 150.0–400.0)
RBC: 4.24 Mil/uL (ref 3.87–5.11)
RDW: 14.3 % (ref 11.5–15.5)
WBC: 5.9 K/uL (ref 4.0–10.5)

## 2024-11-24 LAB — TSH: TSH: 1.53 u[IU]/mL (ref 0.35–5.50)

## 2024-11-27 ENCOUNTER — Ambulatory Visit: Payer: Self-pay | Admitting: Family Medicine

## 2025-11-29 ENCOUNTER — Encounter: Admitting: Family Medicine
